# Patient Record
Sex: Male | Born: 1970 | Race: White | Hispanic: No | Marital: Married | Smoking: Never smoker
Health system: Southern US, Community
[De-identification: ages and names within clinical notes are randomized; demographics above are authoritative.]

## PROBLEM LIST (undated history)

## (undated) DIAGNOSIS — I10 Essential (primary) hypertension: Secondary | ICD-10-CM

## (undated) DIAGNOSIS — T7840XA Allergy, unspecified, initial encounter: Secondary | ICD-10-CM

## (undated) HISTORY — DX: Allergy, unspecified, initial encounter: T78.40XA

## (undated) HISTORY — DX: Essential (primary) hypertension: I10

---

## 2011-04-25 ENCOUNTER — Ambulatory Visit (AMBULATORY_SURGERY_CENTER): Payer: BC Managed Care – PPO | Admitting: *Deleted

## 2011-04-25 VITALS — Ht 66.0 in | Wt 150.0 lb

## 2011-04-25 DIAGNOSIS — Z800 Family history of malignant neoplasm of digestive organs: Secondary | ICD-10-CM

## 2011-04-25 MED ORDER — PEG-KCL-NACL-NASULF-NA ASC-C 100 G PO SOLR: 1.0000 | Freq: Once | ORAL | Status: AC

## 2011-04-26 ENCOUNTER — Encounter: Payer: Self-pay | Admitting: Internal Medicine

## 2011-05-07 ENCOUNTER — Telehealth: Payer: Self-pay | Admitting: Internal Medicine

## 2011-05-07 NOTE — Telephone Encounter (Signed)
[facility] called in to [facility] in [place], [place].  Pt notified. Roberto Kennedy

## 2011-05-09 ENCOUNTER — Encounter: Payer: Self-pay | Admitting: Internal Medicine

## 2011-05-09 ENCOUNTER — Ambulatory Visit (AMBULATORY_SURGERY_CENTER): Payer: BC Managed Care – PPO | Admitting: Internal Medicine

## 2011-05-09 VITALS — BP 153/88 | HR 98 | Temp 97.4°F | Resp 15 | Ht 66.0 in | Wt 150.0 lb

## 2011-05-09 DIAGNOSIS — Z800 Family history of malignant neoplasm of digestive organs: Secondary | ICD-10-CM

## 2011-05-09 DIAGNOSIS — K620 Anal polyp: Secondary | ICD-10-CM

## 2011-05-09 DIAGNOSIS — D126 Benign neoplasm of colon, unspecified: Secondary | ICD-10-CM

## 2011-05-09 DIAGNOSIS — Z1211 Encounter for screening for malignant neoplasm of colon: Secondary | ICD-10-CM

## 2011-05-09 DIAGNOSIS — D128 Benign neoplasm of rectum: Secondary | ICD-10-CM

## 2011-05-09 HISTORY — PX: COLONOSCOPY: SHX174

## 2011-05-09 MED ORDER — SODIUM CHLORIDE 0.9 % IV SOLN: 500.0000 mL | INTRAVENOUS | Status: DC

## 2011-05-09 NOTE — Progress Notes (Signed)
Pt had his eyes open most of the colonoscopy.  Meds were ordered  by Dr. Leone Payor.  I asked if he wanted the pt to have more sedation, 'NO, I'm at the cecum.'maw

## 2011-05-09 NOTE — Patient Instructions (Signed)
Findings : Polyps  Teaching Material given to pt on polyps Discharge instructions explained and given to pt and care giver

## 2011-05-10 ENCOUNTER — Telehealth: Payer: Self-pay | Admitting: *Deleted

## 2011-05-10 NOTE — Telephone Encounter (Signed)

## 2011-05-15 NOTE — Progress Notes (Signed)
Quick Note:  Adenoma removed and hyperplastic polyp removed Repeat routine colonoscopy 5 years May 2017 (also has family hx crca) Letter created for [facility] ______

## 2013-06-09 ENCOUNTER — Other Ambulatory Visit: Payer: Self-pay | Admitting: Family Medicine

## 2013-06-10 ENCOUNTER — Other Ambulatory Visit: Payer: Self-pay | Admitting: *Deleted

## 2013-06-10 MED ORDER — OLMESARTAN MEDOXOMIL 40 MG PO TABS: ORAL_TABLET | ORAL | Status: DC

## 2013-06-19 ENCOUNTER — Other Ambulatory Visit: Payer: Self-pay | Admitting: Family Medicine

## 2013-07-07 ENCOUNTER — Encounter: Payer: Self-pay | Admitting: Family Medicine

## 2013-07-07 ENCOUNTER — Ambulatory Visit (INDEPENDENT_AMBULATORY_CARE_PROVIDER_SITE_OTHER): Payer: BC Managed Care – PPO | Admitting: Family Medicine

## 2013-07-07 VITALS — BP 123/74 | HR 70 | Temp 97.8°F | Ht 65.75 in | Wt 152.6 lb

## 2013-07-07 DIAGNOSIS — Z125 Encounter for screening for malignant neoplasm of prostate: Secondary | ICD-10-CM

## 2013-07-07 DIAGNOSIS — Z0000 Encounter for general adult medical examination without abnormal findings: Secondary | ICD-10-CM

## 2013-07-07 DIAGNOSIS — J309 Allergic rhinitis, unspecified: Secondary | ICD-10-CM

## 2013-07-07 DIAGNOSIS — R5383 Other fatigue: Secondary | ICD-10-CM

## 2013-07-07 DIAGNOSIS — B079 Viral wart, unspecified: Secondary | ICD-10-CM

## 2013-07-07 DIAGNOSIS — Z139 Encounter for screening, unspecified: Secondary | ICD-10-CM

## 2013-07-07 DIAGNOSIS — I10 Essential (primary) hypertension: Secondary | ICD-10-CM

## 2013-07-07 DIAGNOSIS — R5381 Other malaise: Secondary | ICD-10-CM

## 2013-07-07 LAB — POCT URINALYSIS DIPSTICK
Bilirubin, UA: NEGATIVE
Blood, UA: NEGATIVE
Glucose, UA: NEGATIVE
Ketones, UA: NEGATIVE
Spec Grav, UA: 1.01

## 2013-07-07 LAB — POCT CBC
Granulocyte percent: 59 %G (ref 37–80)
Lymph, poc: 2.1 (ref 0.6–3.4)
MPV: 7.8 fL (ref 0–99.8)
POC Granulocyte: 3.7 (ref 2–6.9)
POC LYMPH PERCENT: 33 %L (ref 10–50)
Platelet Count, POC: 234 10*3/uL (ref 142–424)
RBC: 5.1 M/uL (ref 4.69–6.13)

## 2013-07-07 LAB — POCT UA - MICROSCOPIC ONLY
Bacteria, U Microscopic: NEGATIVE
Mucus, UA: NEGATIVE
WBC, Ur, HPF, POC: NEGATIVE

## 2013-07-07 MED ORDER — OLMESARTAN MEDOXOMIL 40 MG PO TABS: ORAL_TABLET | ORAL | Status: DC

## 2013-07-07 MED ORDER — FLUTICASONE PROPIONATE 50 MCG/ACT NA SUSP: NASAL | Status: DC

## 2013-07-07 MED ORDER — AZELASTINE HCL 0.15 % NA SOLN: 1.0000 | Freq: Every day | NASAL | Status: DC

## 2013-07-07 NOTE — Progress Notes (Signed)
  Subjective:    Patient ID: Roberto Kennedy, male    DOB: 03-30-1971, 42 y.o.   MRN: [id number]  HPI Patient comes in today for routine followup of his chronic medical problems which include allergic rhinitis and hypertension. His family history is stable. His father has had a stroke and colon cancer but is living and is doing well. His brother has hypertension also. He does complain of a small skin lesion on his left scrotum. The visit today also coincides with his need for a more complete physical today.   Review of Systems  Constitutional: Negative.   HENT: Positive for postnasal drip (in the AM). Negative for sneezing.   Eyes: Negative.   Respiratory: Negative.   Cardiovascular: Negative.   Gastrointestinal: Negative.   Endocrine: Negative.   Genitourinary: Negative.   Musculoskeletal: Negative.   Skin: Negative.   Allergic/Immunologic: Positive for environmental allergies (seasonal).  Neurological: Negative.   Hematological: Negative.   Psychiatric/Behavioral: Negative.        Objective:   Physical Exam BP 123/74  Pulse 70  Temp(Src) 97.8 F (36.6 C) (Oral)  Ht 5' 5.75" (1.67 m)  Wt 152 lb 9.6 oz (69.219 kg)  BMI 24.82 kg/m2  The patient appeared well nourished and normally developed, alert and oriented to time and place. Speech, behavior and judgement appear normal. Vital signs as documented.  Head exam is unremarkable. No scleral icterus or pallor noted. There was nasal congestion bilaterally left greater than right. Left was more red and inflamed. Eardrums were normal throat was normal. Neck is without jugular venous distension, thyromegally, or carotid bruits. Carotid upstrokes are brisk bilaterally. No cervical adenopathy. Lungs are clear anteriorly and posteriorly to auscultation. Normal respiratory effort. Cardiac exam reveals regular rate and rhythm at 60 per minute. First and second heart sounds normal.  No murmurs, rubs or gallops.  Abdominal exam reveals normal  bowl sounds, no masses, no organomegaly and no aortic enlargement. No inguinal adenopathy. Rectal exam revealed no masses. The prostate was smooth without any lops. The testicles were normal and there was no hernia on either side. There was a small flat appearing wart on the left scrotal area. We did cryotherapy on this area. Extremities are nonedematous and both femoral and pedal pulses are normal. Skin without pallor or jaundice.  Warm and dry, without rash. Neurologic exam reveals normal deep tendon reflexes and normal sensation.          Assessment & Plan:  1. Hypertension - BASIC METABOLIC PANEL WITH GFR -Continue Benicar 40  2. Screening - POCT urinalysis dipstick - POCT UA - Microscopic Only - Thyroid Panel With TSH - Hepatic function panel - NMR Lipoprofile with Lipids - PSA  3. Fatigue - POCT CBC - Vitamin D 25 hydroxy - Thyroid Panel With TSH  4. Allergic rhinitis -Continue current therapy   5. Special screening for malignant neoplasm of prostate - PSA  6. Annual physical exam  Patient Instructions  Continue medications as directed Continue aggressive therapeutic lifestyle changes This summer drink plenty of fluids Use protective gear when working in environmental situations that may be irritating to your allergies   Nyra Capes MD

## 2013-07-07 NOTE — Patient Instructions (Signed)
Continue medications as directed Continue aggressive therapeutic lifestyle changes This summer drink plenty of fluids Use protective gear when working in environmental situations that may be irritating to your allergies

## 2013-07-08 ENCOUNTER — Telehealth: Payer: Self-pay | Admitting: *Deleted

## 2013-07-08 LAB — BASIC METABOLIC PANEL WITH GFR
BUN: 10 mg/dL (ref 6–23)
CO2: 29 mEq/L (ref 19–32)
Calcium: 9.7 mg/dL (ref 8.4–10.5)
Creat: 0.86 mg/dL (ref 0.50–1.35)
Glucose, Bld: 89 mg/dL (ref 70–99)
Sodium: 142 mEq/L (ref 135–145)

## 2013-07-08 LAB — HEPATIC FUNCTION PANEL
Albumin: 4.9 g/dL (ref 3.5–5.2)
Alkaline Phosphatase: 52 U/L (ref 39–117)
Bilirubin, Direct: 0.2 mg/dL (ref 0.0–0.3)
Total Bilirubin: 0.6 mg/dL (ref 0.3–1.2)

## 2013-07-08 LAB — NMR LIPOPROFILE WITH LIPIDS
Cholesterol, Total: 153 mg/dL (ref <200)
HDL Size: 9.8 nm (ref >=9.2)
HDL-C: 65 mg/dL (ref >=40)
Large HDL-P: 11.9 umol/L (ref >=4.8)
Triglycerides: 60 mg/dL (ref <150)
VLDL Size: 48.9 nm — ABNORMAL HIGH (ref <=46.6)

## 2013-07-08 LAB — THYROID PANEL WITH TSH
T4, Total: 8 ug/dL (ref 5.0–12.5)
TSH: 1.57 u[IU]/mL (ref 0.350–4.500)

## 2013-07-08 LAB — PSA: PSA: 0.33 ng/mL (ref <=4.00)

## 2013-07-08 NOTE — Telephone Encounter (Signed)
Message copied by Bearl Mulberry on Wed Jul 08, 2013  6:15 PM ------      Message from: Ernestina Penna      Created: Tue Jul 07, 2013  2:11 PM       Urinalysis is within normal limits.      The CBC is within normal limits with a normal platelet count, a hemoglobin of 16.3 and a white blood cell count that is good ------

## 2013-07-08 NOTE — Telephone Encounter (Signed)
Pt notified of results

## 2013-07-14 ENCOUNTER — Other Ambulatory Visit (INDEPENDENT_AMBULATORY_CARE_PROVIDER_SITE_OTHER): Payer: BC Managed Care – PPO

## 2013-07-14 DIAGNOSIS — Z1212 Encounter for screening for malignant neoplasm of rectum: Secondary | ICD-10-CM

## 2013-07-15 LAB — FECAL OCCULT BLOOD, IMMUNOCHEMICAL: Fecal Occult Blood: NEGATIVE

## 2013-10-28 ENCOUNTER — Other Ambulatory Visit: Payer: Self-pay | Admitting: Family Medicine

## 2013-11-12 ENCOUNTER — Encounter: Payer: Self-pay | Admitting: Family Medicine

## 2013-11-12 ENCOUNTER — Ambulatory Visit (INDEPENDENT_AMBULATORY_CARE_PROVIDER_SITE_OTHER): Payer: BC Managed Care – PPO | Admitting: Family Medicine

## 2013-11-12 ENCOUNTER — Encounter: Payer: Self-pay | Admitting: *Deleted

## 2013-11-12 VITALS — BP 127/81 | HR 82 | Temp 98.7°F | Ht 65.75 in | Wt 155.0 lb

## 2013-11-12 DIAGNOSIS — J309 Allergic rhinitis, unspecified: Secondary | ICD-10-CM

## 2013-11-12 DIAGNOSIS — I10 Essential (primary) hypertension: Secondary | ICD-10-CM

## 2013-11-12 DIAGNOSIS — Z23 Encounter for immunization: Secondary | ICD-10-CM

## 2013-11-12 LAB — POCT CBC
Hemoglobin: 15.5 g/dL (ref 14.1–18.1)
Lymph, poc: 2 (ref 0.6–3.4)
MCHC: 32.4 g/dL (ref 31.8–35.4)
MPV: 8.7 fL (ref 0–99.8)
POC Granulocyte: 3.8 (ref 2–6.9)
POC LYMPH PERCENT: 33.6 %L (ref 10–50)
Platelet Count, POC: 221 10*3/uL (ref 142–424)
RBC: 5.3 M/uL (ref 4.69–6.13)

## 2013-11-12 NOTE — Patient Instructions (Addendum)
Continue current medications. Continue good therapeutic lifestyle changes which include good diet and exercise. Fall precautions discussed with patient. Use a cool mist humidifier in her bedroom at night, Use unscented fabric softeners, detergents, and soap In the winter months keep the house is cool as possible and drink plenty of fluids

## 2013-11-12 NOTE — Progress Notes (Signed)
Subjective:    Patient ID: Roberto Kennedy, male    DOB: Oct 12, 1971, 41 y.o.   MRN: [id number]  HPI Pt here for follow up and management of chronic medical problems. Patient is doing well with no particular complaints other than his blood pressure and his allergies. In reviewing his family history everything is stable with his parents and his 2 brothers.      Patient Active Problem List   Diagnosis Date Noted  . Hypertension 07/07/2013  . Allergic rhinitis 07/07/2013   Outpatient Encounter Prescriptions as of 11/12/2013  Medication Sig  . Azelastine HCl (ASTEPRO) 0.15 % SOLN Place 1 spray into the nose daily.  . fluticasone (FLONASE) 50 MCG/ACT nasal spray USE 2 SPRAYS IN EACH NOSTRIL AT BEDTIME  . loratadine (CLARITIN) 10 MG tablet Take 10 mg by mouth daily.  Marland Kitchen olmesartan (BENICAR) 40 MG tablet Take 40 mg by mouth daily.  . [DISCONTINUED] olmesartan (BENICAR) 40 MG tablet Take half a tablet by mouth every day as directed  . [DISCONTINUED] fluticasone (FLONASE) 50 MCG/ACT nasal spray USE 2 SPRAYS IN EACH NOSTRIL AT BEDTIME  . [DISCONTINUED] 0.9 %  sodium chloride infusion     Review of Systems  Constitutional: Negative.   HENT: Negative.   Eyes: Negative.   Respiratory: Negative.   Cardiovascular: Negative.   Gastrointestinal: Negative.   Endocrine: Negative.   Genitourinary: Negative.   Musculoskeletal: Negative.   Skin: Negative.   Allergic/Immunologic: Negative.   Neurological: Negative.   Hematological: Negative.   Psychiatric/Behavioral: Negative.        Objective:   Physical Exam  Nursing note and vitals reviewed. Constitutional: He is oriented to person, place, and time. He appears well-developed and well-nourished. No distress.  HENT:  Head: Normocephalic and atraumatic.  Right Ear: External ear normal.  Left Ear: External ear normal.  Mouth/Throat: Oropharynx is clear and moist. No oropharyngeal exudate.  Nasal congestion bilaterally  Eyes: Conjunctivae and  EOM are normal. Pupils are equal, round, and reactive to light. Right eye exhibits no discharge. Left eye exhibits no discharge. No scleral icterus.  Neck: Normal range of motion. Neck supple. No tracheal deviation present. No thyromegaly present.  Cardiovascular: Normal rate, regular rhythm and normal heart sounds.  Exam reveals no gallop and no friction rub.   No murmur heard. Pulmonary/Chest: Effort normal and breath sounds normal. No respiratory distress. He has no wheezes. He has no rales. He exhibits no tenderness.  No axillary adenopathy  Abdominal: Soft. Bowel sounds are normal. He exhibits no mass. There is no tenderness. There is no rebound and no guarding.  No inguinal adenopathy  Musculoskeletal: Normal range of motion. He exhibits no edema and no tenderness.  Lymphadenopathy:    He has no cervical adenopathy.  Neurological: He is alert and oriented to person, place, and time. He has normal reflexes. No cranial nerve deficit.  Skin: Skin is warm and dry. No rash noted. No erythema. No pallor.  Dry skin  Psychiatric: He has a normal mood and affect. His behavior is normal. Judgment and thought content normal.   BP 127/81  Pulse 82  Temp(Src) 98.7 F (37.1 C) (Oral)  Ht 5' 5.75" (1.67 m)  Wt 155 lb (70.308 kg)  BMI 25.21 kg/m2        Assessment & Plan:   1. Hypertension   2. Allergic rhinitis    Orders Placed This Encounter  Procedures  . Hepatic function panel  . BMP8+EGFR  . NMR, lipoprofile  . POCT CBC  Current Outpatient Prescriptions on File Prior to Visit  Medication Sig Dispense Refill  . Azelastine HCl (ASTEPRO) 0.15 % SOLN Place 1 spray into the nose daily.  90 mL  4  . fluticasone (FLONASE) 50 MCG/ACT nasal spray USE 2 SPRAYS IN EACH NOSTRIL AT BEDTIME  48 g  4  . loratadine (CLARITIN) 10 MG tablet Take 10 mg by mouth daily.       No current facility-administered medications on file prior to visit.   Patient Instructions  Continue current  medications. Continue good therapeutic lifestyle changes which include good diet and exercise. Fall precautions discussed with patient. Use a cool mist humidifier in her bedroom at night, Use unscented fabric softeners, detergents, and soap In the winter months keep the house is cool as possible and drink plenty of fluids    Nyra Capes MD

## 2013-11-12 NOTE — Progress Notes (Signed)
Quick Note:  Copy of labs sent to patient ______ 

## 2013-11-14 LAB — BMP8+EGFR
CO2: 25 mmol/L (ref 18–29)
Calcium: 9.7 mg/dL (ref 8.7–10.2)
Creatinine, Ser: 0.84 mg/dL (ref 0.76–1.27)
GFR calc Af Amer: 124 mL/min/{1.73_m2} (ref >59)
GFR calc non Af Amer: 107 mL/min/{1.73_m2} (ref >59)
Glucose: 94 mg/dL (ref 65–99)
Potassium: 4.9 mmol/L (ref 3.5–5.2)
Sodium: 140 mmol/L (ref 134–144)

## 2013-11-14 LAB — NMR, LIPOPROFILE
Cholesterol: 166 mg/dL (ref <200)
HDL Cholesterol by NMR: 79 mg/dL (ref >=40)
LDL Particle Number: 639 nmol/L (ref <1000)
LDL Size: 21.8 nm (ref >20.5)
LDLC SERPL CALC-MCNC: 76 mg/dL (ref <100)
LP-IR Score: <25 (ref <=45)
Triglycerides by NMR: 53 mg/dL (ref <150)

## 2013-11-14 LAB — HEPATIC FUNCTION PANEL
ALT: 21 IU/L (ref 0–44)
AST: 31 IU/L (ref 0–40)
Bilirubin, Direct: 0.12 mg/dL (ref 0.00–0.40)
Total Bilirubin: 0.4 mg/dL (ref 0.0–1.2)
Total Protein: 7.1 g/dL (ref 6.0–8.5)

## 2013-12-02 ENCOUNTER — Other Ambulatory Visit: Payer: Self-pay | Admitting: Family Medicine

## 2014-01-14 ENCOUNTER — Telehealth: Payer: Self-pay | Admitting: Family Medicine

## 2014-01-15 ENCOUNTER — Other Ambulatory Visit: Payer: Self-pay | Admitting: *Deleted

## 2014-01-15 MED ORDER — OLMESARTAN MEDOXOMIL 40 MG PO TABS: 20.0000 mg | ORAL_TABLET | Freq: Every day | ORAL | Status: DC

## 2014-01-15 NOTE — Telephone Encounter (Signed)
Requesting samples of Benicar. Samples as well as savings card placed up front.

## 2014-01-15 NOTE — Telephone Encounter (Signed)
Zadie Rhine requested PA on this 01/15/14

## 2014-01-20 ENCOUNTER — Telehealth: Payer: Self-pay | Admitting: *Deleted

## 2014-01-20 MED ORDER — VALSARTAN 320 MG PO TABS: 320.0000 mg | ORAL_TABLET | Freq: Every day | ORAL | Status: DC

## 2014-01-20 NOTE — Telephone Encounter (Signed)
Ins. Co has denied benicar because it is required that at least one of theformulary alternatives be tried and failed or be found to be detrimental to pts health.  These include cozaar/hyzaar, Diovan/diovan hct, etc..

## 2014-01-21 NOTE — Telephone Encounter (Signed)
Pt notified and verbalized understanding. Wanted med called to [facility].

## 2014-05-05 ENCOUNTER — Encounter: Payer: Self-pay | Admitting: Family Medicine

## 2014-05-05 ENCOUNTER — Other Ambulatory Visit: Payer: Self-pay | Admitting: *Deleted

## 2014-05-05 ENCOUNTER — Ambulatory Visit (INDEPENDENT_AMBULATORY_CARE_PROVIDER_SITE_OTHER): Payer: BC Managed Care – PPO | Admitting: Family Medicine

## 2014-05-05 VITALS — BP 119/83 | HR 87 | Temp 97.4°F | Ht 65.75 in | Wt 149.2 lb

## 2014-05-05 DIAGNOSIS — J069 Acute upper respiratory infection, unspecified: Secondary | ICD-10-CM

## 2014-05-05 MED ORDER — AZITHROMYCIN 250 MG PO TABS: ORAL_TABLET | ORAL | Status: DC

## 2014-05-05 MED ORDER — METHYLPREDNISOLONE ACETATE 40 MG/ML IJ SUSP
80.0000 mg | Freq: Once | INTRAMUSCULAR | Status: AC
  Administered 2014-05-05: 80 mg via INTRAMUSCULAR

## 2014-05-05 NOTE — Progress Notes (Signed)
   Subjective:    Patient ID: Roberto Kennedy, male    DOB: 1971-07-09, 43 y.o.   MRN: [id number]  HPI This 43 y.o. male presents for evaluation of sore throat, sinus discomfort, fever, facial pressure And mucoupurulent drainage.   Review of Systems C/o uri sx's   No chest pain, SOB, HA, dizziness, vision change, N/V, diarrhea, constipation, dysuria, urinary urgency or frequency, myalgias, arthralgias or rash.  Objective:   Physical Exam Vital signs noted  Well developed well nourished male.  HEENT - Head atraumatic Normocephalic                Eyes - PERRLA, Conjuctiva - clear Sclera- Clear EOMI                Ears - EAC's Wnl TM's Wnl Gross Hearing WNL                Throat - oropharanx wnl Respiratory - Lungs CTA bilateral Cardiac - RRR S1 and S2 without murmur GI - Abdomen soft Nontender and bowel sounds active x 4       Assessment & Plan:  URI, acute - Plan: azithromycin (ZITHROMAX) 250 MG tablet Depomedrol 80mg  IM  Push po fluids, rest, tylenol and motrin otc prn as directed for fever, arthralgias, and myalgias.  Follow up prn if sx's continue or persist.  Lysbeth Penner FNP

## 2014-05-25 ENCOUNTER — Encounter: Payer: Self-pay | Admitting: *Deleted

## 2014-07-12 ENCOUNTER — Ambulatory Visit: Payer: BC Managed Care – PPO | Admitting: Family Medicine

## 2014-07-13 ENCOUNTER — Ambulatory Visit (INDEPENDENT_AMBULATORY_CARE_PROVIDER_SITE_OTHER): Payer: BC Managed Care – PPO

## 2014-07-13 ENCOUNTER — Encounter: Payer: Self-pay | Admitting: Family Medicine

## 2014-07-13 ENCOUNTER — Ambulatory Visit (INDEPENDENT_AMBULATORY_CARE_PROVIDER_SITE_OTHER): Payer: BC Managed Care – PPO | Admitting: Family Medicine

## 2014-07-13 VITALS — BP 146/96 | HR 71 | Temp 97.4°F | Ht 65.75 in | Wt 143.0 lb

## 2014-07-13 DIAGNOSIS — I10 Essential (primary) hypertension: Secondary | ICD-10-CM

## 2014-07-13 DIAGNOSIS — J301 Allergic rhinitis due to pollen: Secondary | ICD-10-CM

## 2014-07-13 MED ORDER — LOSARTAN POTASSIUM 50 MG PO TABS: 50.0000 mg | ORAL_TABLET | Freq: Every day | ORAL | Status: DC

## 2014-07-13 NOTE — Patient Instructions (Addendum)
Continue current medications. Continue good therapeutic lifestyle changes which include good diet and exercise. Fall precautions discussed with patient. If an FOBT was given today- please return it to our front desk. If you are over 44 years old - you may need Prevnar 50 or the adult Pneumonia vaccine.  Watch sodium intake Continue aggressive therapeutic lifestyle changes Avoid caffeine

## 2014-07-13 NOTE — Progress Notes (Signed)
Subjective:    Patient ID: Roberto Kennedy, male    DOB: 04-Dec-1971, 43 y.o.   MRN: [id number]  HPI Pt here for follow up and management of chronic medical problems. The patient has had issues with taking his regular blood pressure medication due to insurance. He has not had any blood pressure medications for 2 months. He has been checking his blood pressures at home and they've been running in the 140s over the 90s.        Patient Active Problem List   Diagnosis Date Noted  . Hypertension 07/07/2013  . Allergic rhinitis 07/07/2013   Outpatient Encounter Prescriptions as of 07/13/2014  Medication Sig  . [DISCONTINUED] Azelastine HCl (ASTEPRO) 0.15 % SOLN Place 1 spray into the nose daily.  . [DISCONTINUED] azithromycin (ZITHROMAX) 250 MG tablet Take 2 po first day and then one po qd x 4 days  . [DISCONTINUED] fluticasone (FLONASE) 50 MCG/ACT nasal spray USE 2 SPRAYS IN EACH NOSTRIL AT BEDTIME  . [DISCONTINUED] loratadine (CLARITIN) 10 MG tablet Take 10 mg by mouth daily.    Review of Systems  Constitutional: Negative.   HENT: Negative.   Eyes: Negative.   Respiratory: Negative.   Cardiovascular: Negative.   Gastrointestinal: Negative.   Endocrine: Negative.   Genitourinary: Negative.   Musculoskeletal: Negative.   Skin: Negative.   Allergic/Immunologic: Negative.   Neurological: Negative.   Hematological: Negative.   Psychiatric/Behavioral: Negative.        Objective:   Physical Exam  Nursing note and vitals reviewed. Constitutional: He is oriented to person, place, and time. He appears well-developed and well-nourished.  HENT:  Head: Normocephalic and atraumatic.  Right Ear: External ear normal.  Left Ear: External ear normal.  Nose: Nose normal.  Mouth/Throat: Oropharynx is clear and moist. No oropharyngeal exudate.  Eyes: Conjunctivae and EOM are normal. Pupils are equal, round, and reactive to light. Right eye exhibits no discharge. Left eye exhibits no  discharge. No scleral icterus.  Neck: Normal range of motion. Neck supple. No thyromegaly present.  No carotid bruits  Cardiovascular: Normal rate, regular rhythm, normal heart sounds and intact distal pulses.  Exam reveals no gallop and no friction rub.   No murmur heard. At 72 per minute  Pulmonary/Chest: Effort normal and breath sounds normal. No respiratory distress. He has no wheezes. He has no rales. He exhibits no tenderness.  Abdominal: Soft. Bowel sounds are normal. He exhibits no mass. There is no tenderness. There is no rebound and no guarding.  Musculoskeletal: Normal range of motion. He exhibits no edema and no tenderness.  Lymphadenopathy:    He has no cervical adenopathy.  Neurological: He is alert and oriented to person, place, and time. He has normal reflexes. No cranial nerve deficit.  Skin: Skin is warm and dry. No rash noted. No erythema. No pallor.  Psychiatric: He has a normal mood and affect. His behavior is normal. Judgment and thought content normal.   BP 146/96  Pulse 71  Temp(Src) 97.4 F (36.3 C) (Oral)  Ht 5' 5.75" (1.67 m)  Wt 143 lb (64.864 kg)  BMI 23.26 kg/m2  [facility] reading (PRIMARY) by  Dr. Wonda Horner active disease on chest x-ray                                        Assessment & Plan:  1. Essential hypertension - DG Chest 2 View; Future -  POCT CBC; Future - BMP8+EGFR; Future - Hepatic function panel; Future - NMR, lipoprofile; Future - losartan (COZAAR) 50 MG tablet; Take 1 tablet (50 mg total) by mouth daily.  Dispense: 90 tablet; Refill: 3  2. Allergic rhinitis due to pollen -Restart nose sprays if needed for ragweed season  Meds ordered this encounter  Medications  . losartan (COZAAR) 50 MG tablet    Sig: Take 1 tablet (50 mg total) by mouth daily.    Dispense:  90 tablet    Refill:  3   Patient Instructions  Continue current medications. Continue good therapeutic lifestyle changes which include good diet and exercise. Fall  precautions discussed with patient. If an FOBT was given today- please return it to our front desk. If you are over 68 years old - you may need Prevnar 48 or the adult Pneumonia vaccine.  Watch sodium intake Continue aggressive therapeutic lifestyle changes Avoid caffeine   Arrie Senate MD

## 2014-07-14 ENCOUNTER — Telehealth: Payer: Self-pay

## 2014-07-14 NOTE — Telephone Encounter (Signed)
Pt aware of CXR results.

## 2014-07-14 NOTE — Telephone Encounter (Signed)
Message copied by Koren Bound on Wed Jul 14, 2014 11:11 AM ------      Message from: Chipper Herb      Created: Tue Jul 13, 2014  5:21 PM       As per radiology report ------

## 2015-02-03 ENCOUNTER — Encounter: Payer: Self-pay | Admitting: Nurse Practitioner

## 2015-02-03 ENCOUNTER — Ambulatory Visit (INDEPENDENT_AMBULATORY_CARE_PROVIDER_SITE_OTHER): Payer: 59 | Admitting: Nurse Practitioner

## 2015-02-03 VITALS — BP 153/89 | HR 95 | Temp 97.7°F | Ht 65.0 in | Wt 150.0 lb

## 2015-02-03 DIAGNOSIS — J0100 Acute maxillary sinusitis, unspecified: Secondary | ICD-10-CM

## 2015-02-03 MED ORDER — AZITHROMYCIN 250 MG PO TABS: ORAL_TABLET | ORAL | Status: DC

## 2015-02-03 NOTE — Progress Notes (Signed)
  Subjective:     Roberto Kennedy is a 44 y.o. male who presents for evaluation of symptoms of a URI, possible sinusitis. Symptoms include congestion, cough described as nonproductive, facial pain, nasal congestion, sinus pressure and sore throat. Onset of symptoms was 4 days ago, and has been gradually worsening since that time. Treatment to date: antihistamines, decongestants and nasal steroids.  The following portions of the patient's history were reviewed and updated as appropriate: allergies, current medications, past family history, past medical history, past social history, past surgical history and problem list.  Review of Systems Pertinent items are noted in HPI.   Objective:    General appearance: alert and cooperative Head: Normocephalic, without obvious abnormality, atraumatic Eyes: conjunctivae/corneas clear. PERRL, EOM's intact. Fundi benign. Ears: normal TM's and external ear canals both ears Nose: Nares normal. Septum midline. Mucosa normal. No drainage or sinus tenderness., mild congestion, sinus tenderness bilateral Throat: lips, mucosa, and tongue normal; teeth and gums normal Neck: no adenopathy, no carotid bruit, supple, symmetrical, trachea midline and thyroid not enlarged, symmetric, no tenderness/mass/nodules Lungs: clear to auscultation bilaterally Chest wall: no tenderness Skin: Skin color, texture, turgor normal. No rashes or lesions   Assessment:    sinusitis and viral upper respiratory illness   Plan:   Meds ordered this encounter  Medications  . azithromycin (ZITHROMAX) 250 MG tablet    Sig: Two tablets day one, then one tablet daily next 4 days.    Dispense:  6 tablet    Refill:  0    Order Specific Question:  Supervising Provider    Answer:  Chipper Herb [[id number]]    1. Take meds as prescribed 2. Use a cool mist humidifier especially during the winter months and when heat has been humid. 3. Use saline nose sprays frequently 4. Saline irrigations  of the nose can be very helpful if done frequently.  * 4X daily for 1 week*  * Use of a nettie pot can be helpful with this. Follow directions with this* 5. Drink plenty of fluids 6. Keep thermostat turn down low 7.For any cough or congestion  Use plain Mucinex- regular strength or max strength is fine   * Children- consult with Pharmacist for dosing 8. For fever or aces or pains- take tylenol or ibuprofen appropriate for age and weight.  * for fevers greater than 101 orally you may alternate ibuprofen and tylenol every  3 hours.   Mary-Margaret Hassell Done, FNP

## 2015-02-03 NOTE — Patient Instructions (Signed)

## 2015-03-21 ENCOUNTER — Encounter: Payer: Self-pay | Admitting: Family Medicine

## 2015-03-21 ENCOUNTER — Ambulatory Visit (INDEPENDENT_AMBULATORY_CARE_PROVIDER_SITE_OTHER): Payer: BLUE CROSS/BLUE SHIELD | Admitting: Family Medicine

## 2015-03-21 ENCOUNTER — Other Ambulatory Visit: Payer: Self-pay | Admitting: Family Medicine

## 2015-03-21 VITALS — BP 137/84 | HR 67 | Temp 98.1°F | Ht 65.0 in | Wt 153.4 lb

## 2015-03-21 DIAGNOSIS — R05 Cough: Secondary | ICD-10-CM

## 2015-03-21 DIAGNOSIS — J209 Acute bronchitis, unspecified: Secondary | ICD-10-CM | POA: Diagnosis not present

## 2015-03-21 DIAGNOSIS — R059 Cough, unspecified: Secondary | ICD-10-CM

## 2015-03-21 DIAGNOSIS — J310 Chronic rhinitis: Secondary | ICD-10-CM

## 2015-03-21 DIAGNOSIS — J329 Chronic sinusitis, unspecified: Secondary | ICD-10-CM

## 2015-03-21 DIAGNOSIS — R509 Fever, unspecified: Secondary | ICD-10-CM

## 2015-03-21 DIAGNOSIS — J029 Acute pharyngitis, unspecified: Secondary | ICD-10-CM | POA: Diagnosis not present

## 2015-03-21 DIAGNOSIS — J301 Allergic rhinitis due to pollen: Secondary | ICD-10-CM

## 2015-03-21 LAB — POCT INFLUENZA A/B
Influenza A, POC: NEGATIVE
Influenza B, POC: NEGATIVE

## 2015-03-21 LAB — POCT RAPID STREP A (OFFICE): RAPID STREP A SCREEN: NEGATIVE

## 2015-03-21 MED ORDER — AMOXICILLIN-POT CLAVULANATE 875-125 MG PO TABS: 1.0000 | ORAL_TABLET | Freq: Two times a day (BID) | ORAL | Status: DC

## 2015-03-21 MED ORDER — FLUTICASONE PROPIONATE 50 MCG/ACT NA SUSP: NASAL | Status: DC

## 2015-03-21 NOTE — Progress Notes (Signed)
Subjective:    Patient ID: Roberto Kennedy, male    DOB: 12-20-1971, 44 y.o.   MRN: [id number]  HPI Patient is here today for drainage, cough, sore throat and fever that started on Saturday.     Review of Systems  Constitutional: Positive for fever.  HENT: Positive for ear pain (Right), rhinorrhea and sinus pressure.   Respiratory: Positive for cough (prod, yellow).   Endocrine: Negative.   Genitourinary: Negative.   Neurological: Positive for dizziness and headaches.  Hematological: Negative.   Psychiatric/Behavioral: Negative.    Patient Active Problem List   Diagnosis Date Noted  . Hypertension 07/07/2013  . Allergic rhinitis 07/07/2013   Outpatient Encounter Prescriptions as of 03/21/2015  Medication Sig  . Azelastine HCl 0.15 % SOLN Place 1 spray into the nose daily.  . fluticasone (FLONASE) 50 MCG/ACT nasal spray Place 2 sprays into both nostrils daily.  Marland Kitchen losartan (COZAAR) 50 MG tablet Take 1 tablet (50 mg total) by mouth daily.  . [DISCONTINUED] azithromycin (ZITHROMAX) 250 MG tablet Two tablets day one, then one tablet daily next 4 days. (Patient not taking: Reported on 03/21/2015)      Objective:   Physical Exam  Constitutional: He is oriented to person, place, and time. He appears well-developed and well-nourished. No distress.  HENT:  Head: Normocephalic and atraumatic.  Right Ear: External ear normal.  Left Ear: External ear normal.  Mouth/Throat: No oropharyngeal exudate.  Throat is red posteriorly  Nasal congestion bilaterally left greater than right Ethmoid sinus tenderness  Eyes: Conjunctivae and EOM are normal. Pupils are equal, round, and reactive to light. Right eye exhibits no discharge. Left eye exhibits no discharge. No scleral icterus.  Neck: Normal range of motion. Neck supple. No thyromegaly present.  No anterior cervical adenopathy  Cardiovascular: Normal rate, regular rhythm and normal heart sounds.  Exam reveals no friction rub.   No murmur  heard. Pulmonary/Chest: Effort normal and breath sounds normal. No respiratory distress. He has no wheezes. He has no rales. He exhibits no tenderness.  Bronchial congestion with coughing otherwise lungs are clear  Abdominal: He exhibits no mass.  Musculoskeletal: Normal range of motion.  Lymphadenopathy:    He has no cervical adenopathy.  Neurological: He is alert and oriented to person, place, and time.  Skin: Skin is warm and dry. No rash noted.  Psychiatric: He has a normal mood and affect. His behavior is normal. Judgment and thought content normal.  Nursing note and vitals reviewed.  BP 137/84 mmHg  Pulse 67  Temp(Src) 98.1 F (36.7 C) (Oral)  Ht 5\' 5"  (1.651 m)  Wt 153 lb 6.4 oz (69.582 kg)  BMI 25.53 kg/m2  Results for orders placed or performed in visit on 03/21/15  POCT Influenza A/B  Result Value Ref Range   Influenza A, POC Negative    Influenza B, POC Negative   POCT rapid strep A  Result Value Ref Range   Rapid Strep A Screen Negative Negative         Assessment & Plan:  1. Sore throat -Take antibiotic as directed and gargle with warm salty water - POCT Influenza A/B - POCT rapid strep A  2. Cough -Take Mucinex regularly twice daily - POCT Influenza A/B - POCT rapid strep A  3. Other specified fever -Take Tylenol for aches pains - POCT Influenza A/B - POCT rapid strep A  4. Allergic rhinitis due to pollen -Continue with Astelin and get back on Flonase and use nasal saline during  the day - fluticasone (FLONASE) 50 MCG/ACT nasal spray; One to 2 sprays each nostril at bedtime  Dispense: 16 g; Refill: 12  5. Rhinosinusitis -Plenty of fluids and use nasal saline - fluticasone (FLONASE) 50 MCG/ACT nasal spray; One to 2 sprays each nostril at bedtime  Dispense: 16 g; Refill: 12 - amoxicillin-clavulanate (AUGMENTIN) 875-125 MG per tablet; Take 1 tablet by mouth 2 (two) times daily.  Dispense: 20 tablet; Refill: 0  6. Acute bronchitis, unspecified  organism -Take and complete all of antibiotic and use Mucinex for cough and congestion - amoxicillin-clavulanate (AUGMENTIN) 875-125 MG per tablet; Take 1 tablet by mouth 2 (two) times daily.  Dispense: 20 tablet; Refill: 0  Patient Instructions  Drink plenty of fluids Take ibuprofen or Tylenol as needed for aches pains and fever Continue to take Mucinex maximum strength, blue and white in color, 1 twice daily with a large glass of water Take antibiotic as directed Use nasal saline frequently through the day Continue Astelin and Flonase   Arrie Senate MD

## 2015-03-21 NOTE — Patient Instructions (Signed)
Drink plenty of fluids Take ibuprofen or Tylenol as needed for aches pains and fever Continue to take Mucinex maximum strength, blue and white in color, 1 twice daily with a large glass of water Take antibiotic as directed Use nasal saline frequently through the day Continue Astelin and Flonase

## 2015-03-22 ENCOUNTER — Other Ambulatory Visit: Payer: Self-pay | Admitting: *Deleted

## 2015-04-21 IMAGING — CR DG CHEST 2V
2 series · 2 of 2 positions shown · non-contrast
Comparison: None.

CLINICAL DATA: Yearly exam

EXAM:
CHEST  2 VIEW

[view not recorded (1 of 2)]
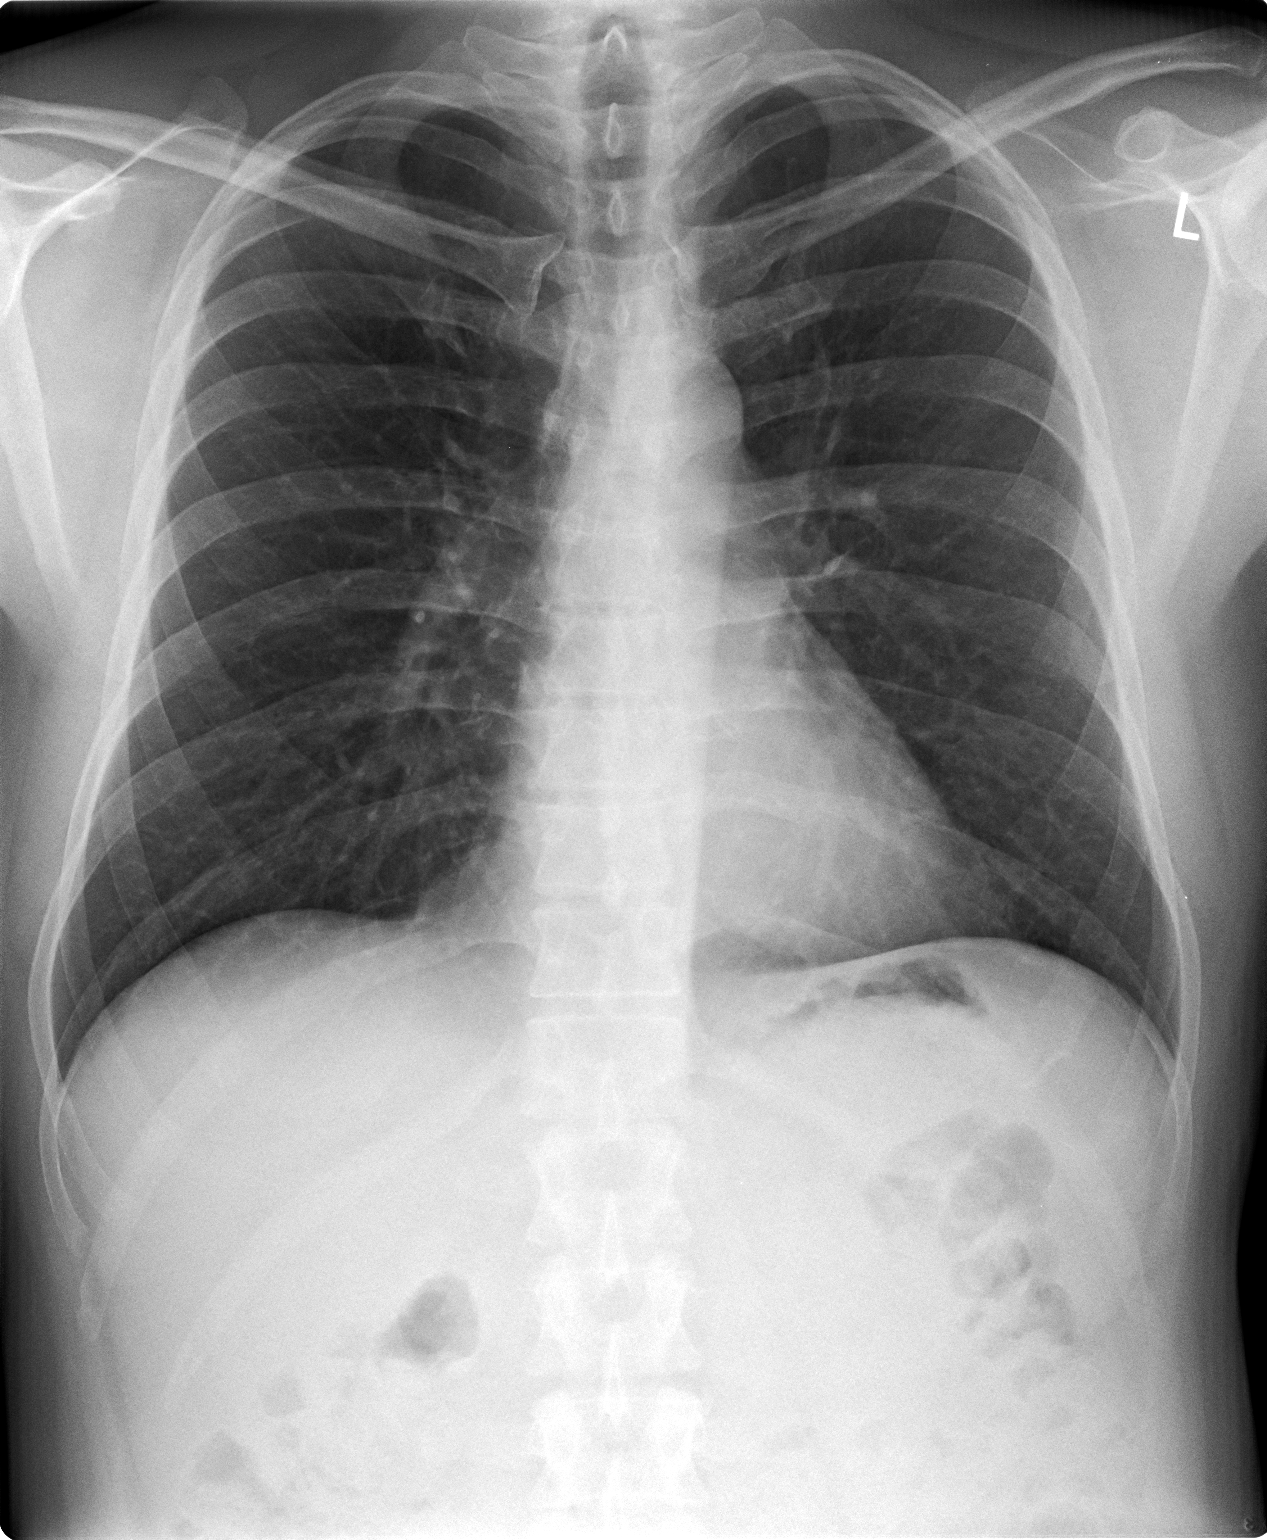

[view not recorded (2 of 2)]
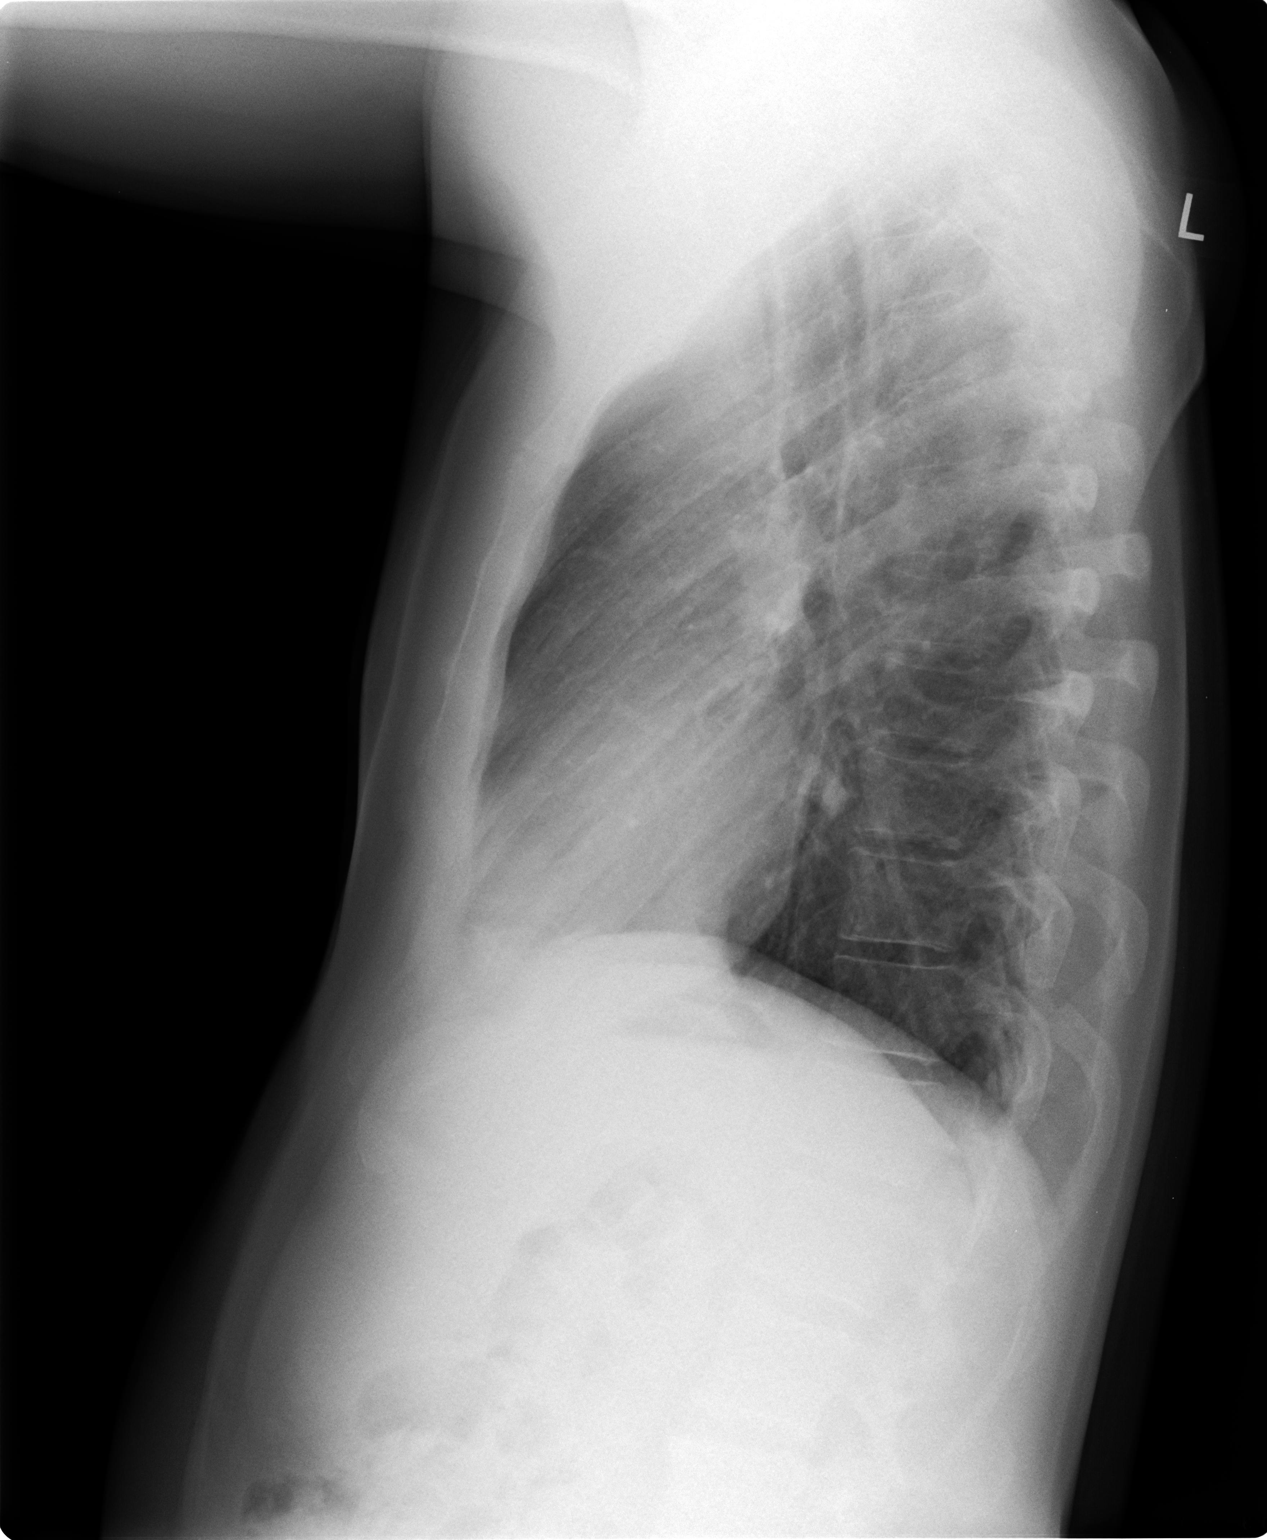

[2 of 2 positions shown; findings below may reference images not displayed]

FINDINGS: The heart size and mediastinal contours are within normal limits.
Both lungs are clear. The visualized skeletal structures are
unremarkable.
IMPRESSION: No active cardiopulmonary disease.

## 2015-08-09 ENCOUNTER — Other Ambulatory Visit: Payer: Self-pay | Admitting: Family Medicine

## 2015-08-09 NOTE — Telephone Encounter (Signed)
Last seen 03/21/15  DWM

## 2016-02-16 ENCOUNTER — Other Ambulatory Visit: Payer: Self-pay | Admitting: Family Medicine

## 2016-04-04 ENCOUNTER — Other Ambulatory Visit: Payer: Self-pay

## 2016-04-11 ENCOUNTER — Other Ambulatory Visit: Payer: Self-pay | Admitting: Family Medicine

## 2016-04-12 ENCOUNTER — Ambulatory Visit (INDEPENDENT_AMBULATORY_CARE_PROVIDER_SITE_OTHER): Payer: BLUE CROSS/BLUE SHIELD | Admitting: Family Medicine

## 2016-04-12 ENCOUNTER — Encounter: Payer: Self-pay | Admitting: Family Medicine

## 2016-04-12 ENCOUNTER — Encounter (INDEPENDENT_AMBULATORY_CARE_PROVIDER_SITE_OTHER): Payer: Self-pay

## 2016-04-12 VITALS — BP 144/89 | HR 85 | Temp 97.4°F | Ht 65.0 in | Wt 157.0 lb

## 2016-04-12 DIAGNOSIS — I10 Essential (primary) hypertension: Secondary | ICD-10-CM | POA: Diagnosis not present

## 2016-04-12 MED ORDER — LOSARTAN POTASSIUM 50 MG PO TABS: 50.0000 mg | ORAL_TABLET | Freq: Every day | ORAL | Status: DC

## 2016-04-12 NOTE — Progress Notes (Signed)
Subjective:    Patient ID: Roberto Kennedy, male    DOB: Sep 04, 1971, 45 y.o.   MRN: [id number]  HPI Patient here today for refill on medication. He will schedule a CPE later in the year.The patient is doing well overall. He denies any chest pain shortness of breath problems with her stoma or musculoskeletal system. He is not having any problems passing his water. His family history is significant in that his dad is 37 and has had colon cancer and has had a stroke. He is the middle of 3 boys in both of his brothers one older one younger are overweight and both have hypertension. The patient indicates that he is put on some weight recently and we'll work to lose this. He will continue to watch his sodium intake. He says his blood pressures at home run similar to what they have been running here that more than the 130s over the 80s. He denies any chest pain or shortness of breath. He has no GI symptoms. He is passing his water without problems.      Patient Active Problem List   Diagnosis Date Noted  . Hypertension 07/07/2013  . Allergic rhinitis 07/07/2013   Outpatient Encounter Prescriptions as of 04/12/2016  Medication Sig  . losartan (COZAAR) 50 MG tablet TAKE 1 TABLET BY MOUTH EVERY DAY  . Azelastine HCl 0.15 % SOLN USE 1 SPRAY IN NOSTRIL(S) EVERY DAY - 100 DAY SUPPLY (Patient not taking: Reported on 04/12/2016)  . fluticasone (FLONASE) 50 MCG/ACT nasal spray One to 2 sprays each nostril at bedtime (Patient not taking: Reported on 04/12/2016)  . [DISCONTINUED] amoxicillin-clavulanate (AUGMENTIN) 875-125 MG per tablet Take 1 tablet by mouth 2 (two) times daily.   No facility-administered encounter medications on file as of 04/12/2016.     Review of Systems  Constitutional: Negative.   HENT: Negative.   Eyes: Negative.   Respiratory: Negative.   Cardiovascular: Negative.   Gastrointestinal: Negative.   Endocrine: Negative.   Genitourinary: Negative.   Musculoskeletal: Negative.   Skin:  Negative.   Allergic/Immunologic: Negative.   Neurological: Negative.   Hematological: Negative.   Psychiatric/Behavioral: Negative.        Objective:   Physical Exam  Constitutional: He is oriented to person, place, and time. He appears well-developed and well-nourished.  HENT:  Head: Normocephalic and atraumatic.  Right Ear: External ear normal.  Left Ear: External ear normal.  Nose: Nose normal.  Mouth/Throat: Oropharynx is clear and moist. No oropharyngeal exudate.  Eyes: Conjunctivae and EOM are normal. Pupils are equal, round, and reactive to light. Right eye exhibits no discharge. Left eye exhibits no discharge. No scleral icterus.  Neck: Normal range of motion. Neck supple. No thyromegaly present.  Cardiovascular: Normal rate, regular rhythm and normal heart sounds.   No murmur heard. Pulmonary/Chest: Effort normal and breath sounds normal. No respiratory distress. He has no wheezes. He has no rales. He exhibits no tenderness.  Abdominal: Soft. Bowel sounds are normal. He exhibits no mass. There is no tenderness. There is no rebound and no guarding.  Musculoskeletal: Normal range of motion. He exhibits no edema.  Lymphadenopathy:    He has no cervical adenopathy.  Neurological: He is alert and oriented to person, place, and time.  Skin: Skin is warm and dry. No rash noted.  Psychiatric: He has a normal mood and affect. His behavior is normal. Judgment and thought content normal.  Nursing note and vitals reviewed.  BP 144/89 mmHg  Pulse 85  Temp(Src)  97.4 F (36.3 C) (Oral)  Ht '5\' 5"'  (1.651 m)  Wt 157 lb (71.215 kg)  BMI 26.13 kg/m2        Assessment & Plan:  1. Essential hypertension -Continue current medication and bring readings by a blood pressures at home in 2-3 weeks -If the blood pressures remain elevated we will consider increasing the lowsartin to 100 mg instead of 50 -Work on weight loss and sodium restriction - BMP8+EGFR - CBC with  Differential/Platelet - Hepatic function panel - Lipid panel - losartan (COZAAR) 50 MG tablet; Take 1 tablet (50 mg total) by mouth daily.  Dispense: 90 tablet; Refill: 3  Patient Instructions  Continue current medications. Continue good therapeutic lifestyle changes which include good diet and exercise. Fall precautions discussed with patient. If an FOBT was given today- please return it to our front desk. If you are over 88 years old - you may need Prevnar 77 or the adult Pneumonia vaccine.  **Flu shots are available--- please call and schedule a FLU-CLINIC appointment**  After your visit with [place] today you will receive a survey in the mail or online from [employer] regarding your care with [place]. Please take a moment to fill this out. Your feedback is very important to [place] as you can help [place] better understand your patient needs as well as improve your experience and satisfaction. WE CARE ABOUT YOU!!!   Monitor blood pressures at home and bring readings by and 2-3 weeks for review Continue to take current medicine Work aggressively on weight loss and sodium restriction We will call with lab work results as soon as those results become available   Arrie Senate MD

## 2016-04-12 NOTE — Patient Instructions (Addendum)
Continue current medications. Continue good therapeutic lifestyle changes which include good diet and exercise. Fall precautions discussed with patient. If an FOBT was given today- please return it to our front desk. If you are over 46 years old - you may need Prevnar 31 or the adult Pneumonia vaccine.  **Flu shots are available--- please call and schedule a FLU-CLINIC appointment**  After your visit with [place] today you will receive a survey in the mail or online from [employer] regarding your care with [place]. Please take a moment to fill this out. Your feedback is very important to [place] as you can help [place] better understand your patient needs as well as improve your experience and satisfaction. WE CARE ABOUT YOU!!!   Monitor blood pressures at home and bring readings by and 2-3 weeks for review Continue to take current medicine Work aggressively on weight loss and sodium restriction We will call with lab work results as soon as those results become available

## 2016-04-13 LAB — CBC WITH DIFFERENTIAL/PLATELET
Basophils Absolute: 0 10*3/uL (ref 0.0–0.2)
Basos: 0 %
EOS (ABSOLUTE): 0.1 10*3/uL (ref 0.0–0.4)
EOS: 2 %
HEMATOCRIT: 45.8 % (ref 37.5–51.0)
HEMOGLOBIN: 15.5 g/dL (ref 12.6–17.7)
Immature Grans (Abs): 0 10*3/uL (ref 0.0–0.1)
Immature Granulocytes: 0 %
LYMPHS ABS: 2.1 10*3/uL (ref 0.7–3.1)
Lymphs: 36 %
MCH: 31 pg (ref 26.6–33.0)
MCHC: 33.8 g/dL (ref 31.5–35.7)
MCV: 92 fL (ref 79–97)
MONOCYTES: 10 %
Monocytes Absolute: 0.6 10*3/uL (ref 0.1–0.9)
NEUTROS ABS: 3 10*3/uL (ref 1.4–7.0)
Neutrophils: 52 %
Platelets: 254 10*3/uL (ref 150–379)
RBC: 5 x10E6/uL (ref 4.14–5.80)
RDW: 13.2 % (ref 12.3–15.4)
WBC: 5.9 10*3/uL (ref 3.4–10.8)

## 2016-04-13 LAB — HEPATIC FUNCTION PANEL
ALK PHOS: 60 IU/L (ref 39–117)
ALT: 23 IU/L (ref 0–44)
AST: 28 IU/L (ref 0–40)
Albumin: 5.2 g/dL (ref 3.5–5.5)
Bilirubin Total: 0.5 mg/dL (ref 0.0–1.2)
Bilirubin, Direct: 0.14 mg/dL (ref 0.00–0.40)
Total Protein: 7.5 g/dL (ref 6.0–8.5)

## 2016-04-13 LAB — LIPID PANEL
CHOL/HDL RATIO: 2.2 ratio (ref 0.0–5.0)
CHOLESTEROL TOTAL: 182 mg/dL (ref 100–199)
HDL: 83 mg/dL (ref >39)
LDL Calculated: 88 mg/dL (ref 0–99)
TRIGLYCERIDES: 53 mg/dL (ref 0–149)
VLDL Cholesterol Cal: 11 mg/dL (ref 5–40)

## 2016-04-13 LAB — BMP8+EGFR
BUN / CREAT RATIO: 15 (ref 9–20)
BUN: 13 mg/dL (ref 6–24)
CO2: 26 mmol/L (ref 18–29)
CREATININE: 0.89 mg/dL (ref 0.76–1.27)
Calcium: 9.9 mg/dL (ref 8.7–10.2)
Chloride: 98 mmol/L (ref 96–106)
GFR calc non Af Amer: 103 mL/min/{1.73_m2} (ref >59)
GFR, EST AFRICAN AMERICAN: 119 mL/min/{1.73_m2} (ref >59)
Glucose: 93 mg/dL (ref 65–99)
Potassium: 4.6 mmol/L (ref 3.5–5.2)
Sodium: 141 mmol/L (ref 134–144)

## 2016-07-03 ENCOUNTER — Encounter: Payer: Self-pay | Admitting: Internal Medicine

## 2016-08-10 ENCOUNTER — Telehealth: Payer: Self-pay | Admitting: Family Medicine

## 2016-08-10 NOTE — Telephone Encounter (Signed)
Labs printed and mailed.

## 2016-10-10 ENCOUNTER — Telehealth: Payer: Self-pay | Admitting: Family Medicine

## 2016-10-10 ENCOUNTER — Ambulatory Visit (INDEPENDENT_AMBULATORY_CARE_PROVIDER_SITE_OTHER): Payer: PRIVATE HEALTH INSURANCE | Admitting: Family Medicine

## 2016-10-10 ENCOUNTER — Encounter: Payer: Self-pay | Admitting: Family Medicine

## 2016-10-10 VITALS — BP 128/83 | HR 79 | Temp 97.7°F | Ht 65.0 in | Wt 152.2 lb

## 2016-10-10 DIAGNOSIS — L989 Disorder of the skin and subcutaneous tissue, unspecified: Secondary | ICD-10-CM

## 2016-10-10 NOTE — Progress Notes (Signed)
   Subjective:    Patient ID: Roberto Kennedy, male    DOB: Nov 16, 1971, 45 y.o.   MRN: [id number]  HPI  Pt is here today for a skin lesion on his R upper chest that he noticed 6 weeks ago.  Pt also has a cystic lesion on his L forearm that he noticed 2 weeks ago.    Review of Systems  Skin: Positive for wound (L forearm x 2 wks).              Objective:   Physical Exam  Constitutional: He appears well-developed and well-nourished. No distress.  HENT:  Head: Normocephalic.  Eyes: Conjunctivae and EOM are normal. Pupils are equal, round, and reactive to light. Right eye exhibits no discharge. Left eye exhibits no discharge. No scleral icterus.  Neck: Normal range of motion.  Skin: Skin is warm and dry.  2 skin lesions are of concern one on the chest which may be a seborrheic keratosis. Another on the left forearm which is raised shiny and glistening. This has come up and if rapid manner.  Psychiatric: He has a normal mood and affect. His behavior is normal. Judgment and thought content normal.  Nursing note and vitals reviewed.  BP 135/89 (BP Location: Left Arm, Patient Position: Sitting, Cuff Size: Normal)   Pulse 77   Temp 97.7 F (36.5 C) (Oral)   Ht 5\' 5"  (1.651 m)   Wt 152 lb 3.2 oz (69 kg)   BMI 25.33 kg/m         Assessment & Plan:  1. Skin lesion of left arm -We will arrange for patient to see [facility] dermatology  Patient Instructions  We will get an appointment as soon as possible with the dermatologist  Arrie Senate MD

## 2016-10-10 NOTE — Telephone Encounter (Signed)
Last office note faxed to [facility].

## 2016-10-10 NOTE — Patient Instructions (Signed)
We will get an appointment as soon as possible with the dermatologist

## 2017-04-23 ENCOUNTER — Other Ambulatory Visit: Payer: Self-pay | Admitting: *Deleted

## 2017-04-23 DIAGNOSIS — R7989 Other specified abnormal findings of blood chemistry: Secondary | ICD-10-CM

## 2017-04-23 DIAGNOSIS — R945 Abnormal results of liver function studies: Principal | ICD-10-CM

## 2017-04-23 DIAGNOSIS — I10 Essential (primary) hypertension: Secondary | ICD-10-CM

## 2017-04-23 MED ORDER — LOSARTAN POTASSIUM 50 MG PO TABS: 50.0000 mg | ORAL_TABLET | Freq: Every day | ORAL | 3 refills | Status: DC

## 2017-07-24 ENCOUNTER — Other Ambulatory Visit: Payer: PRIVATE HEALTH INSURANCE

## 2017-07-24 DIAGNOSIS — E7800 Pure hypercholesterolemia, unspecified: Secondary | ICD-10-CM

## 2017-07-25 LAB — HEPATIC FUNCTION PANEL
ALT: 30 IU/L (ref 0–44)
AST: 34 IU/L (ref 0–40)
Albumin: 5.3 g/dL (ref 3.5–5.5)
Alkaline Phosphatase: 63 IU/L (ref 39–117)
Bilirubin Total: 0.6 mg/dL (ref 0.0–1.2)
Bilirubin, Direct: 0.2 mg/dL (ref 0.00–0.40)
Total Protein: 7.5 g/dL (ref 6.0–8.5)

## 2018-05-08 ENCOUNTER — Encounter: Payer: Self-pay | Admitting: Family Medicine

## 2018-06-24 ENCOUNTER — Other Ambulatory Visit: Payer: Self-pay | Admitting: Family Medicine

## 2018-06-24 DIAGNOSIS — I10 Essential (primary) hypertension: Secondary | ICD-10-CM

## 2018-07-30 ENCOUNTER — Other Ambulatory Visit: Payer: Self-pay | Admitting: Family Medicine

## 2018-07-30 DIAGNOSIS — I10 Essential (primary) hypertension: Secondary | ICD-10-CM

## 2018-07-30 MED ORDER — LOSARTAN POTASSIUM 50 MG PO TABS: 50.0000 mg | ORAL_TABLET | Freq: Every day | ORAL | 0 refills | Status: DC

## 2018-08-27 ENCOUNTER — Ambulatory Visit (INDEPENDENT_AMBULATORY_CARE_PROVIDER_SITE_OTHER): Payer: PRIVATE HEALTH INSURANCE

## 2018-08-27 ENCOUNTER — Encounter: Payer: Self-pay | Admitting: Family Medicine

## 2018-08-27 ENCOUNTER — Ambulatory Visit (INDEPENDENT_AMBULATORY_CARE_PROVIDER_SITE_OTHER): Payer: PRIVATE HEALTH INSURANCE | Admitting: Family Medicine

## 2018-08-27 VITALS — BP 139/90 | HR 73 | Temp 97.5°F | Ht 65.0 in | Wt 146.0 lb

## 2018-08-27 DIAGNOSIS — Z0000 Encounter for general adult medical examination without abnormal findings: Secondary | ICD-10-CM

## 2018-08-27 DIAGNOSIS — C44619 Basal cell carcinoma of skin of left upper limb, including shoulder: Secondary | ICD-10-CM | POA: Insufficient documentation

## 2018-08-27 DIAGNOSIS — B078 Other viral warts: Secondary | ICD-10-CM

## 2018-08-27 DIAGNOSIS — R945 Abnormal results of liver function studies: Secondary | ICD-10-CM

## 2018-08-27 DIAGNOSIS — Z1283 Encounter for screening for malignant neoplasm of skin: Secondary | ICD-10-CM

## 2018-08-27 DIAGNOSIS — I10 Essential (primary) hypertension: Secondary | ICD-10-CM | POA: Diagnosis not present

## 2018-08-27 DIAGNOSIS — E559 Vitamin D deficiency, unspecified: Secondary | ICD-10-CM

## 2018-08-27 DIAGNOSIS — Z800 Family history of malignant neoplasm of digestive organs: Secondary | ICD-10-CM

## 2018-08-27 DIAGNOSIS — R7989 Other specified abnormal findings of blood chemistry: Secondary | ICD-10-CM

## 2018-08-27 MED ORDER — IMIQUIMOD 5 % EX CREA: TOPICAL_CREAM | Freq: Every day | CUTANEOUS | 0 refills | Status: DC

## 2018-08-27 MED ORDER — LOSARTAN POTASSIUM 50 MG PO TABS: 50.0000 mg | ORAL_TABLET | Freq: Every day | ORAL | 3 refills | Status: DC

## 2018-08-27 NOTE — Patient Instructions (Addendum)
Continue current medications. Continue good therapeutic lifestyle changes which include good diet and exercise. Fall precautions discussed with patient. If an FOBT was given today- please return it to our front desk. If you are over 48 years old - you may need Prevnar 54 or the adult Pneumonia vaccine.  **Flu shots are available--- please call and schedule a FLU-CLINIC appointment**  After your visit with [place] today you will receive a survey in the mail or online from [employer] regarding your care with [place]. Please take a moment to fill this out. Your feedback is very important to [place] as you can help [place] better understand your patient needs as well as improve your experience and satisfaction. WE CARE ABOUT YOU!!!   We will arrange for you to see the gastroenterologist to have a follow-up colonoscopy because of your father having had colon cancer.  You are actually a couple years past due this. We will also arrange for you to go back and see the dermatologist Please check blood pressures more regularly Bring blood pressure readings by in 4 to 6 weeks and have the nurse to check her blood pressure here Watch sodium intake Get eye exams regularly

## 2018-08-27 NOTE — Progress Notes (Signed)
Subjective:    Patient ID: Roberto Kennedy, male    DOB: [birth year], 47 y.o.   MRN: [id number]  HPI Patient is here today for annual wellness exam and follow up of chronic medical problems which includes hypertension. He is taking medication regularly.  The patient is doing well overall and is concerned today about some skin lesions.  His initial blood pressure was elevated and the repeat blood pressure was better at 139/90.  We will make sure that he is checking readings at home.  He is due for complete physical today.  He brings lab work in with him for review.  This was done in May 2019.  All of the electrolytes were good except the uric acid was elevated at 8.2 and should be closer to or below 7.2.  He still has a liver function test that is elevated and this is the LDH at 199.  I think this is been there and like this before but we will double check that from previous lab work.  Cholesterol numbers have an HDL that is excellent at 73.  The LDL is good at 86.  Total cholesterol is good at 179 and triglycerides are good at 104.  PSA is good at 0.1.  All of the electrolytes including potassium are good and the serum iron is normal.  The CBC was also within normal limits with the white blood cell count not being elevated and hemoglobin being good at 15.5 and the platelet count being good at 237,000.  The patient is currently taking losartan 50 mg daily.  The patient is pleasant and alert.  It is significant to note that his father had colon cancer.  The patient has home had one colonoscopy and this was done in May 2012 and so is a couple years past due on repeating his colonoscopy.  He did not realize it has been that long and we will schedule him to have a repeat colonoscopy soon as we can get him in to follow-up on that.  He denies any chest pain or shortness of breath.  He denies any other problems with his intestinal tract including nausea vomiting swallowing heartburn blood in the stool or black tarry  bowel movements.  He is past due was read [facility] colonoscopy.  He is passing his water well with no complaints.  He has no erectile dysfunction.  He has not been back to see the dermatologist since he had the basal cell cancer removed from the left forearm.  Make sure that he gets a follow-up visit seeing the dermatologist.   Patient Active Problem List   Diagnosis Date Noted  . Hypertension 07/07/2013  . Allergic rhinitis 07/07/2013   Outpatient Encounter Medications as of 08/27/2018  Medication Sig  . losartan (COZAAR) 50 MG tablet Take 1 tablet (50 mg total) by mouth daily.  . [DISCONTINUED] Azelastine HCl 0.15 % SOLN USE 1 SPRAY IN NOSTRIL(S) EVERY DAY - 100 DAY SUPPLY (Patient not taking: Reported on 10/10/2016)  . [DISCONTINUED] fluticasone (FLONASE) 50 MCG/ACT nasal spray One to 2 sprays each nostril at bedtime (Patient not taking: Reported on 10/10/2016)   No facility-administered encounter medications on file as of 08/27/2018.       Review of Systems  Constitutional: Negative.   HENT: Negative.   Eyes: Negative.   Respiratory: Negative.   Cardiovascular: Negative.   Gastrointestinal: Negative.   Endocrine: Negative.   Genitourinary: Negative.   Musculoskeletal: Negative.   Skin: Negative.  Skin lesion   Allergic/Immunologic: Negative.   Neurological: Negative.   Hematological: Negative.   Psychiatric/Behavioral: Negative.        Objective:   Physical Exam  Constitutional: He is oriented to person, place, and time. He appears well-developed and well-nourished. No distress.  The patient is pleasant and relaxed.  HENT:  Head: Normocephalic and atraumatic.  Right Ear: External ear normal.  Left Ear: External ear normal.  Nose: Nose normal.  Mouth/Throat: Oropharynx is clear and moist. No oropharyngeal exudate.  Eyes: Pupils are equal, round, and reactive to light. Conjunctivae and EOM are normal. Right eye exhibits no discharge. Left eye exhibits no discharge. No  scleral icterus.  Neck: Normal range of motion. Neck supple. No thyromegaly present.  No bruits thyromegaly or anterior cervical adenopathy  Cardiovascular: Normal rate, regular rhythm, normal heart sounds and intact distal pulses.  No murmur heard. Heart is 72/min with a regular rate and rhythm and no murmurs  Pulmonary/Chest: Effort normal and breath sounds normal. He has no wheezes. He has no rales. He exhibits no tenderness.  Clear anteriorly and posteriorly and no axillary adenopathy  Abdominal: Soft. Bowel sounds are normal. He exhibits no mass. There is no tenderness.  No abdominal tenderness masses organ enlargement or bruits and no inguinal adenopathy  Genitourinary: Rectum normal and penis normal.  Genitourinary Comments: Prostate is slightly enlarged and slightly asymmetric with the left side being slightly larger than the right.  The prostate was smooth without any lumps or masses.  There were no rectal masses.  The external genitalia were within normal limits and the testicles were normal.  The skin on the left testicle appears to have a couple of skin lesions which may be venereal warts.  The patient says they have been there for maybe 6 months.  Musculoskeletal: Normal range of motion. He exhibits no edema.  Lymphadenopathy:    He has no cervical adenopathy.  Neurological: He is alert and oriented to person, place, and time. He has normal reflexes. No cranial nerve deficit.  Skin: Skin is warm and dry. No rash noted.  Warty type growth on left testicle.  No unusual skin lesions noted but patient needs to go back for follow-up because of the previous basal cell carcinoma.  He should schedule yearly visits.  Psychiatric: He has a normal mood and affect. His behavior is normal. Judgment and thought content normal.  The patient's mood affect and behavior were all normal for him.  Nursing note and vitals reviewed.  BP 139/90   Pulse 73   Temp (!) 97.5 F (36.4 C) (Oral)   Ht 5\' 5"   (1.651 m)   Wt 146 lb (66.2 kg)   BMI 24.30 kg/m         Assessment & Plan:  1. Annual physical exam -Arrange for patient to have colonoscopy -Arrange for patient to have dermatology check because of basal cell carcinoma - Urinalysis, Complete  2. Essential hypertension -Blood pressure today is somewhat borderline and patient should check blood pressures more regularly and come by the office in 4 to 6 weeks for recheck with blood pressure readings from home. - DG Chest 2 View; Future - losartan (COZAAR) 50 MG tablet; Take 1 tablet (50 mg total) by mouth daily.  Dispense: 90 tablet; Refill: 3 - Hepatic function panel  3. Basal cell carcinoma, arm, left -Schedule patient for dermatology check for which she is past due  4. Family history of colon cancer -Schedule patient for colonoscopy which is  past due  5. Elevated liver function tests -Repeat liver function test today  6. Vitamin D deficiency - VITAMIN D 25 Hydroxy (Vit-D Deficiency, Fractures)  7. Other viral warts -Aldara cream, use as directed for 2 weeks  Meds ordered this encounter  Medications  . losartan (COZAAR) 50 MG tablet    Sig: Take 1 tablet (50 mg total) by mouth daily.    Dispense:  90 tablet    Refill:  3  . imiquimod (ALDARA) 5 % cream    Sig: Apply topically at bedtime. For 2 weeks    Dispense:  14 each    Refill:  0   Patient Instructions  Continue current medications. Continue good therapeutic lifestyle changes which include good diet and exercise. Fall precautions discussed with patient. If an FOBT was given today- please return it to our front desk. If you are over 77 years old - you may need Prevnar 47 or the adult Pneumonia vaccine.  **Flu shots are available--- please call and schedule a FLU-CLINIC appointment**  After your visit with [place] today you will receive a survey in the mail or online from [employer] regarding your care with [place]. Please take a moment to fill this out. Your  feedback is very important to [place] as you can help [place] better understand your patient needs as well as improve your experience and satisfaction. WE CARE ABOUT YOU!!!   We will arrange for you to see the gastroenterologist to have a follow-up colonoscopy because of your father having had colon cancer.  You are actually a couple years past due this. We will also arrange for you to go back and see the dermatologist Please check blood pressures more regularly Bring blood pressure readings by in 4 to 6 weeks and have the nurse to check her blood pressure here Watch sodium intake Get eye exams regularly  / Arrie Senate MD

## 2018-08-28 LAB — MICROSCOPIC EXAMINATION
BACTERIA UA: NONE SEEN
CASTS: NONE SEEN /LPF

## 2018-08-28 LAB — URINALYSIS, COMPLETE
BILIRUBIN UA: NEGATIVE
Glucose, UA: NEGATIVE
Leukocytes, UA: NEGATIVE
Nitrite, UA: NEGATIVE
Protein, UA: NEGATIVE
RBC UA: NEGATIVE
Specific Gravity, UA: 1.026 (ref 1.005–1.030)
UUROB: 1 mg/dL (ref 0.2–1.0)
pH, UA: 6.5 (ref 5.0–7.5)

## 2018-08-28 LAB — VITAMIN D 25 HYDROXY (VIT D DEFICIENCY, FRACTURES): Vit D, 25-Hydroxy: 45.3 ng/mL (ref 30.0–100.0)

## 2018-08-28 LAB — HEPATIC FUNCTION PANEL
ALBUMIN: 4.8 g/dL (ref 3.5–5.5)
ALT: 30 IU/L (ref 0–44)
AST: 33 IU/L (ref 0–40)
Alkaline Phosphatase: 52 IU/L (ref 39–117)
BILIRUBIN TOTAL: 0.4 mg/dL (ref 0.0–1.2)
Bilirubin, Direct: 0.18 mg/dL (ref 0.00–0.40)
TOTAL PROTEIN: 6.9 g/dL (ref 6.0–8.5)

## 2018-09-24 ENCOUNTER — Telehealth: Payer: Self-pay | Admitting: Family Medicine

## 2018-09-25 MED ORDER — IMIQUIMOD 5 % EX CREA: TOPICAL_CREAM | Freq: Every day | CUTANEOUS | 2 refills | Status: DC

## 2018-09-25 NOTE — Telephone Encounter (Signed)
Please advise on refill.

## 2018-09-25 NOTE — Telephone Encounter (Signed)
This may be refilled 

## 2018-10-06 ENCOUNTER — Other Ambulatory Visit: Payer: Self-pay | Admitting: *Deleted

## 2018-12-01 ENCOUNTER — Encounter: Payer: Self-pay | Admitting: Family Medicine

## 2019-06-05 IMAGING — DX DG CHEST 2V
2 series · 2 of 2 positions shown · non-contrast
Comparison: 07/13/2014

CLINICAL DATA: 47-year-old with essential hypertension.

EXAM:
CHEST - 2 VIEW

[chest pa]
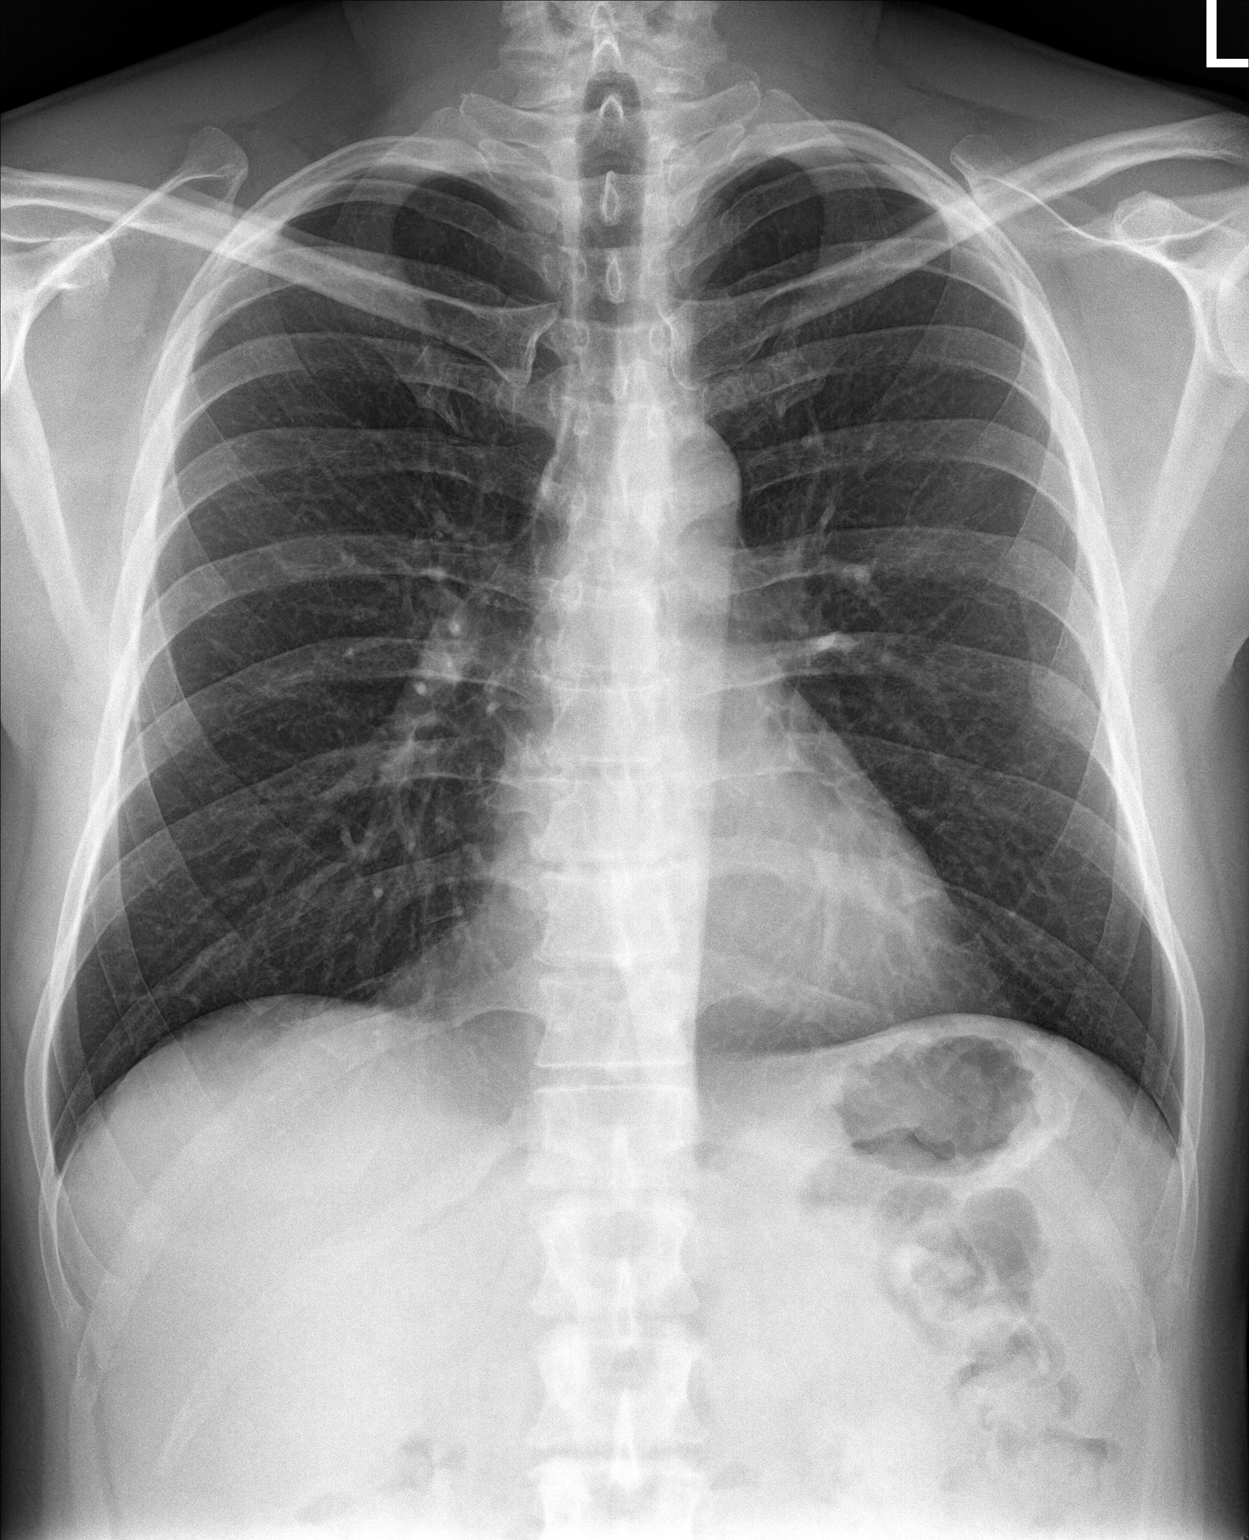

[chest lat]
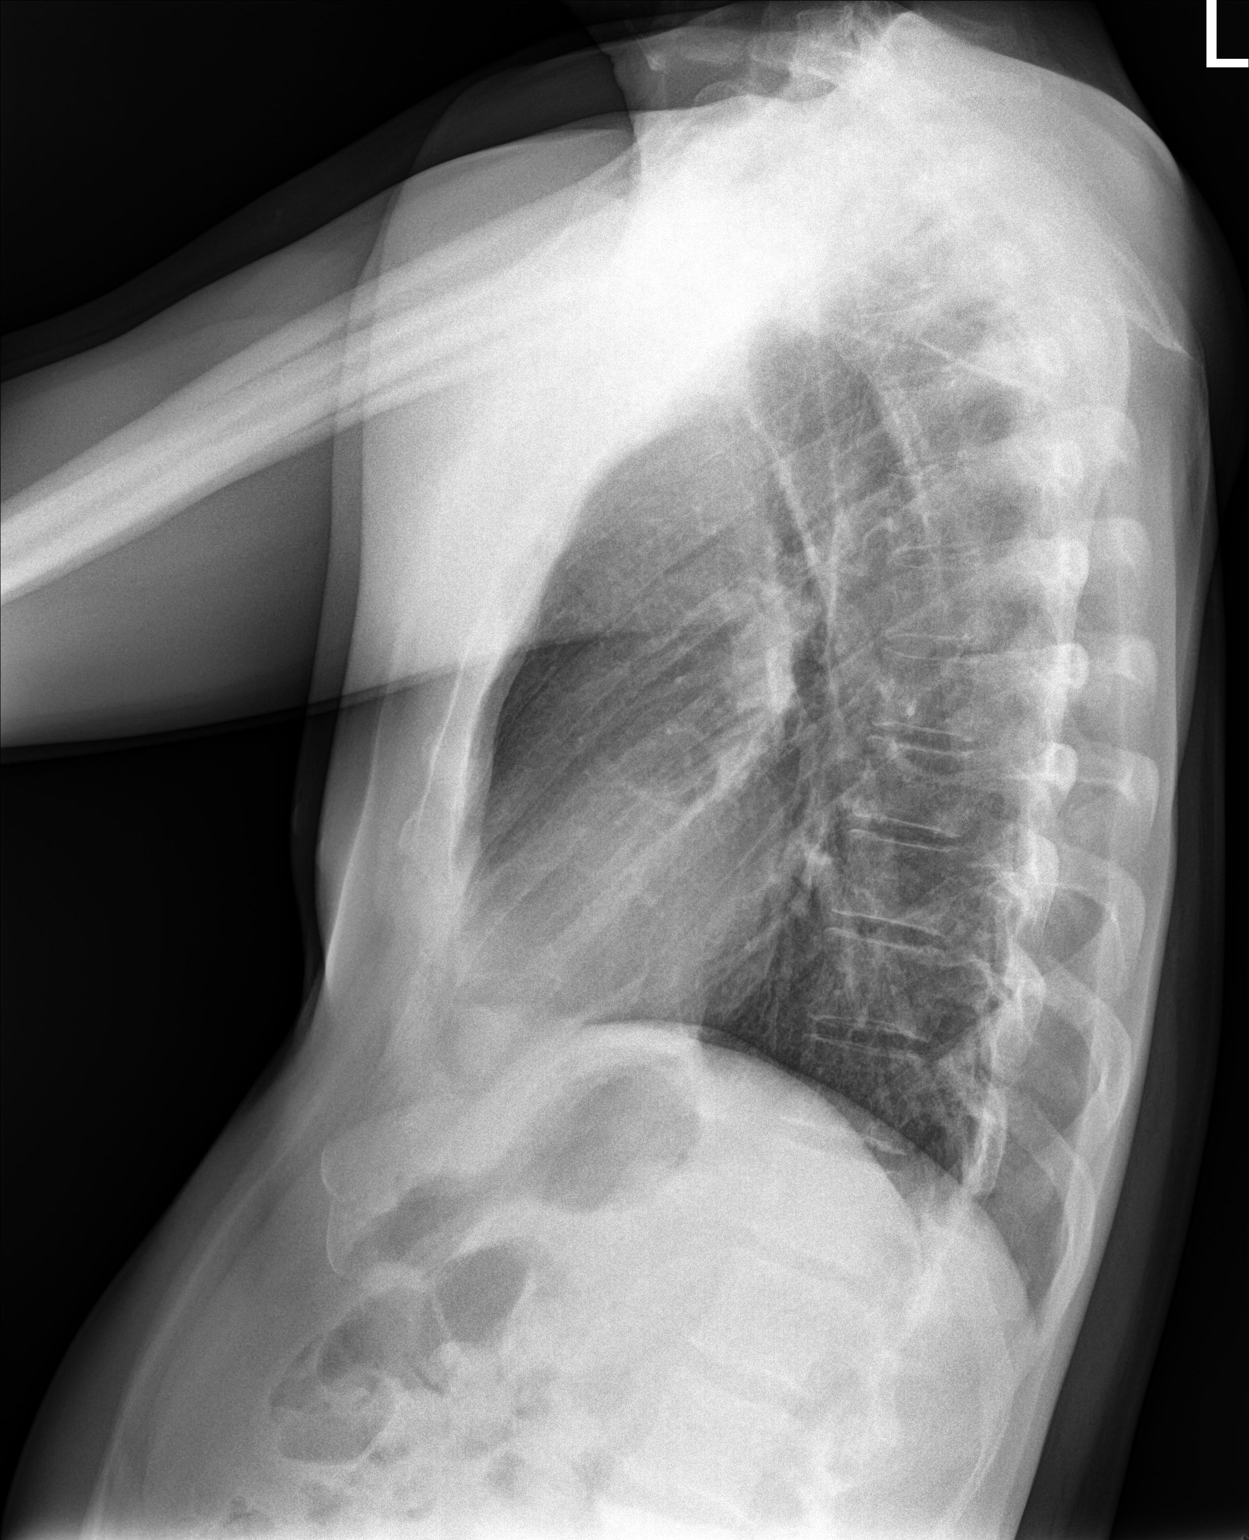

[2 of 2 positions shown; findings below may reference images not displayed]

FINDINGS: Both lungs are clear. Heart and mediastinum are within normal
limits. Trachea is midline. No large pleural effusions. Bone
structures are unremarkable.
IMPRESSION: No active cardiopulmonary disease.

## 2019-07-10 ENCOUNTER — Telehealth: Payer: Self-pay | Admitting: Family Medicine

## 2019-07-10 ENCOUNTER — Encounter: Payer: Self-pay | Admitting: *Deleted

## 2019-08-17 ENCOUNTER — Encounter: Payer: Self-pay | Admitting: Family Medicine

## 2019-08-17 ENCOUNTER — Ambulatory Visit (INDEPENDENT_AMBULATORY_CARE_PROVIDER_SITE_OTHER): Payer: PRIVATE HEALTH INSURANCE | Admitting: Family Medicine

## 2019-08-17 DIAGNOSIS — T63691A Toxic effect of contact with other venomous marine animals, accidental (unintentional), initial encounter: Secondary | ICD-10-CM

## 2019-08-17 MED ORDER — CIPROFLOXACIN HCL 500 MG PO TABS: 500.0000 mg | ORAL_TABLET | Freq: Two times a day (BID) | ORAL | 0 refills | Status: DC

## 2019-08-17 NOTE — Progress Notes (Signed)
    Subjective:    Patient ID: Roberto Kennedy, male    DOB: [birth year], 48 y.o.   MRN: [id number]   HPI: Roberto Kennedy is a 48 y.o. male presenting for stung bby sting ray while surf fishing two days ago. Hit by its tail barb while trying to remove him from the hook to throw it back. Experienced the worst pain of his life. That has died down some and is manageable, but the site is quite red and swollen. Depression screen [facility] 2/9 08/27/2018 10/10/2016 04/12/2016 05/05/2014  Decreased Interest 0 0 0 0  Down, Depressed, Hopeless 0 0 0 0  PHQ - 2 Score 0 0 0 0     Relevant past medical, surgical, family and social history reviewed and updated as indicated.  Interim medical history since our last visit reviewed. Allergies and medications reviewed and updated.  ROS:  Review of Systems  Constitutional: Positive for fatigue. Negative for activity change and fever.  Musculoskeletal: Positive for myalgias.     Social History   Tobacco Use  Smoking Status Never Smoker  Smokeless Tobacco Never Used       Objective:     Wt Readings from Last 3 Encounters:  08/27/18 146 lb (66.2 kg)  10/10/16 152 lb 3.2 oz (69 kg)  04/12/16 157 lb (71.2 kg)     Exam deferred. Pt. Harboring due to COVID 19. Phone visit performed.   Assessment & Plan:   1. Marine animal sting, accidental or unintentional, initial encounter     Meds ordered this encounter  Medications  . ciprofloxacin (CIPRO) 500 MG tablet    Sig: Take 1 tablet (500 mg total) by mouth 2 (two) times daily.    Dispense:  20 tablet    Refill:  0    No orders of the defined types were placed in this encounter.     Diagnoses and all orders for this visit:  Marine animal sting, accidental or unintentional, initial encounter  Other orders -     ciprofloxacin (CIPRO) 500 MG tablet; Take 1 tablet (500 mg total) by mouth 2 (two) times daily.    Virtual Visit via telephone Note  I discussed the limitations, risks, security and  privacy concerns of performing an evaluation and management service by telephone and the availability of in person appointments. The patient was identified with two identifiers. Pt.expressed understanding and agreed to proceed. Pt. Is at home. Dr. Livia Snellen is in his office.  Follow Up Instructions:   I discussed the assessment and treatment plan with the patient. The patient was provided an opportunity to ask questions and all were answered. The patient agreed with the plan and demonstrated an understanding of the instructions.   The patient was advised to call back or seek an in-person evaluation if the symptoms worsen or if the condition fails to improve as anticipated.   Total minutes including chart review and phone contact time: 16   Follow up plan: Return in about 2 days (around 08/19/2019), or if symptoms worsen or fail to improve.  Claretta Fraise, MD [place]

## 2019-08-18 ENCOUNTER — Ambulatory Visit: Payer: PRIVATE HEALTH INSURANCE | Admitting: Family Medicine

## 2019-09-01 ENCOUNTER — Ambulatory Visit: Payer: PRIVATE HEALTH INSURANCE | Admitting: Family Medicine

## 2019-09-02 ENCOUNTER — Encounter: Payer: PRIVATE HEALTH INSURANCE | Admitting: Family Medicine

## 2019-09-16 ENCOUNTER — Other Ambulatory Visit: Payer: Self-pay

## 2019-09-17 ENCOUNTER — Ambulatory Visit (INDEPENDENT_AMBULATORY_CARE_PROVIDER_SITE_OTHER): Payer: PRIVATE HEALTH INSURANCE | Admitting: Family Medicine

## 2019-09-17 ENCOUNTER — Encounter: Payer: Self-pay | Admitting: Family Medicine

## 2019-09-17 VITALS — BP 129/82 | HR 79 | Temp 98.0°F | Resp 20 | Ht 65.0 in | Wt 152.6 lb

## 2019-09-17 DIAGNOSIS — Z0000 Encounter for general adult medical examination without abnormal findings: Secondary | ICD-10-CM

## 2019-09-17 DIAGNOSIS — E739 Lactose intolerance, unspecified: Secondary | ICD-10-CM

## 2019-09-17 DIAGNOSIS — Z1211 Encounter for screening for malignant neoplasm of colon: Secondary | ICD-10-CM | POA: Diagnosis not present

## 2019-09-17 DIAGNOSIS — Z125 Encounter for screening for malignant neoplasm of prostate: Secondary | ICD-10-CM

## 2019-09-17 DIAGNOSIS — Z0001 Encounter for general adult medical examination with abnormal findings: Secondary | ICD-10-CM | POA: Diagnosis not present

## 2019-09-17 DIAGNOSIS — I10 Essential (primary) hypertension: Secondary | ICD-10-CM

## 2019-09-17 MED ORDER — LOSARTAN POTASSIUM 50 MG PO TABS: 50.0000 mg | ORAL_TABLET | Freq: Every day | ORAL | 3 refills | Status: DC

## 2019-09-17 NOTE — Progress Notes (Signed)
Subjective:  Patient ID: Roberto Kennedy, male    DOB: [birth year]  Age: 48 y.o. MRN: [id number]  CC: Annual Exam   HPI [name] presents for Complete physical as well as  follow-up of hypertension. Patient has no history of headache chest pain or shortness of breath or recent cough. Patient also denies symptoms of TIA such as focal numbness or weakness. Patient denies side effects from medication. States taking it regularly.   History [name] has a past medical history of Allergy and Hypertension.   He has a past surgical history that includes Colonoscopy (05/09/2011).   His family history includes Colon cancer (age of onset: 79) in his father.He reports that he has never smoked. He has never used smokeless tobacco. He reports current alcohol use of about 1.0 standard drinks of alcohol per week. He reports that he does not use drugs.  No current outpatient medications on file prior to visit.   No current facility-administered medications on file prior to visit.     ROS Review of Systems  Constitutional: Negative for activity change, fatigue and unexpected weight change.  HENT: Negative for congestion, ear pain, hearing loss, postnasal drip and trouble swallowing.   Eyes: Negative for pain and visual disturbance.  Respiratory: Negative for cough, chest tightness and shortness of breath.   Cardiovascular: Negative for chest pain, palpitations and leg swelling.  Gastrointestinal: Negative for abdominal distention, abdominal pain, blood in stool, constipation, diarrhea, nausea and vomiting.  Endocrine: Negative for cold intolerance, heat intolerance and polydipsia.  Genitourinary: Negative for difficulty urinating, dysuria, flank pain, frequency and urgency.  Musculoskeletal: Negative for arthralgias and joint swelling.  Skin: Negative for color change, rash and wound.  Neurological: Negative for dizziness, syncope, speech difficulty, weakness, light-headedness, numbness and  headaches.  Hematological: Does not bruise/bleed easily.  Psychiatric/Behavioral: Negative for confusion, decreased concentration, dysphoric mood and sleep disturbance. The patient is not nervous/anxious.     Objective:  BP 129/82   Pulse 79   Temp 98 F (36.7 C) (Temporal)   Resp 20   Ht '5\' 5"'  (1.651 m)   Wt 152 lb 9.6 oz (69.2 kg)   SpO2 99%   BMI 25.39 kg/m   BP Readings from Last 3 Encounters:  09/17/19 129/82  08/27/18 139/90  10/10/16 128/83    Wt Readings from Last 3 Encounters:  09/17/19 152 lb 9.6 oz (69.2 kg)  08/27/18 146 lb (66.2 kg)  10/10/16 152 lb 3.2 oz (69 kg)     Physical Exam Constitutional:      Appearance: He is well-developed.  HENT:     Head: Normocephalic and atraumatic.  Eyes:     Pupils: Pupils are equal, round, and reactive to light.  Neck:     Musculoskeletal: Normal range of motion.     Thyroid: No thyromegaly.     Trachea: No tracheal deviation.  Cardiovascular:     Rate and Rhythm: Normal rate and regular rhythm.     Heart sounds: Normal heart sounds. No murmur. No friction rub. No gallop.   Pulmonary:     Breath sounds: Normal breath sounds. No wheezing or rales.  Abdominal:     General: Bowel sounds are normal. There is no distension.     Palpations: Abdomen is soft. There is no mass.     Tenderness: There is no abdominal tenderness.     Hernia: There is no hernia in the left inguinal area.  Genitourinary:    Penis: Normal.  Scrotum/Testes: Normal.  Musculoskeletal: Normal range of motion.  Lymphadenopathy:     Cervical: No cervical adenopathy.  Skin:    General: Skin is warm and dry.  Neurological:     Mental Status: He is alert and oriented to person, place, and time.       Assessment & Plan:   Tosh was seen today for annual exam.  Diagnoses and all orders for this visit:  Well adult exam -     CBC with Differential/Platelet -     CMP14+EGFR -     Lipid panel -     Urinalysis  Essential  hypertension -     CBC with Differential/Platelet -     CMP14+EGFR -     Lipid panel -     Urinalysis -     losartan (COZAAR) 50 MG tablet; Take 1 tablet (50 mg total) by mouth daily.  Screening for prostate cancer -     PSA Total (Reflex To Free)  Screen for colon cancer -     Cancel: Ambulatory referral to Gastroenterology  Lactose intolerance   Allergies as of 09/17/2019      Reactions   Lactose Intolerance (gi) Diarrhea   Sulfa Antibiotics Nausea Only      Medication List       Accurate as of September 17, 2019  3:10 PM. If you have any questions, ask your nurse or doctor.        STOP taking these medications   ciprofloxacin 500 MG tablet Commonly known as: Cipro Stopped by: Claretta Fraise, MD   imiquimod 5 % cream Commonly known as: Aldara Stopped by: Claretta Fraise, MD     TAKE these medications   losartan 50 MG tablet Commonly known as: COZAAR Take 1 tablet (50 mg total) by mouth daily.       Meds ordered this encounter  Medications  . losartan (COZAAR) 50 MG tablet    Sig: Take 1 tablet (50 mg total) by mouth daily.    Dispense:  90 tablet    Refill:  3     Follow-up: Return in about 1 year (around 09/16/2020).  Claretta Fraise, M.D.

## 2019-09-22 ENCOUNTER — Other Ambulatory Visit: Payer: Self-pay

## 2019-09-22 ENCOUNTER — Other Ambulatory Visit: Payer: PRIVATE HEALTH INSURANCE

## 2019-09-22 LAB — URINALYSIS
Bilirubin, UA: NEGATIVE
Glucose, UA: NEGATIVE
Ketones, UA: NEGATIVE
Leukocytes,UA: NEGATIVE
Nitrite, UA: NEGATIVE
Protein,UA: NEGATIVE
RBC, UA: NEGATIVE
Specific Gravity, UA: 1.025 (ref 1.005–1.030)
Urobilinogen, Ur: 0.2 mg/dL (ref 0.2–1.0)
pH, UA: 5 (ref 5.0–7.5)

## 2019-09-23 LAB — CMP14+EGFR
ALT: 18 IU/L (ref 0–44)
AST: 32 IU/L (ref 0–40)
Albumin/Globulin Ratio: 2.3 — ABNORMAL HIGH (ref 1.2–2.2)
Albumin: 4.6 g/dL (ref 4.0–5.0)
Alkaline Phosphatase: 58 IU/L (ref 39–117)
BUN/Creatinine Ratio: 9 (ref 9–20)
BUN: 8 mg/dL (ref 6–24)
Bilirubin Total: 0.4 mg/dL (ref 0.0–1.2)
CO2: 21 mmol/L (ref 20–29)
Calcium: 9.3 mg/dL (ref 8.7–10.2)
Chloride: 106 mmol/L (ref 96–106)
Creatinine, Ser: 0.86 mg/dL (ref 0.76–1.27)
GFR calc Af Amer: 118 mL/min/{1.73_m2} (ref >59)
GFR calc non Af Amer: 102 mL/min/{1.73_m2} (ref >59)
Globulin, Total: 2 g/dL (ref 1.5–4.5)
Glucose: 76 mg/dL (ref 65–99)
Potassium: 4.7 mmol/L (ref 3.5–5.2)
Sodium: 144 mmol/L (ref 134–144)
Total Protein: 6.6 g/dL (ref 6.0–8.5)

## 2019-09-23 LAB — CBC WITH DIFFERENTIAL/PLATELET
Basophils Absolute: 0.1 10*3/uL (ref 0.0–0.2)
Basos: 1 %
EOS (ABSOLUTE): 0.2 10*3/uL (ref 0.0–0.4)
Eos: 4 %
Hematocrit: 45.9 % (ref 37.5–51.0)
Hemoglobin: 14.9 g/dL (ref 13.0–17.7)
Immature Grans (Abs): 0 10*3/uL (ref 0.0–0.1)
Immature Granulocytes: 0 %
Lymphocytes Absolute: 2.1 10*3/uL (ref 0.7–3.1)
Lymphs: 40 %
MCH: 31 pg (ref 26.6–33.0)
MCHC: 32.5 g/dL (ref 31.5–35.7)
MCV: 96 fL (ref 79–97)
Monocytes Absolute: 0.5 10*3/uL (ref 0.1–0.9)
Monocytes: 9 %
Neutrophils Absolute: 2.5 10*3/uL (ref 1.4–7.0)
Neutrophils: 46 %
Platelets: 232 10*3/uL (ref 150–450)
RBC: 4.8 x10E6/uL (ref 4.14–5.80)
RDW: 12.3 % (ref 11.6–15.4)
WBC: 5.3 10*3/uL (ref 3.4–10.8)

## 2019-09-23 LAB — LIPID PANEL
Chol/HDL Ratio: 2.2 ratio (ref 0.0–5.0)
Cholesterol, Total: 162 mg/dL (ref 100–199)
HDL: 75 mg/dL (ref >39)
LDL Chol Calc (NIH): 63 mg/dL (ref 0–99)
Triglycerides: 140 mg/dL (ref 0–149)
VLDL Cholesterol Cal: 24 mg/dL (ref 5–40)

## 2019-09-23 LAB — PSA TOTAL (REFLEX TO FREE): Prostate Specific Ag, Serum: 0.3 ng/mL (ref 0.0–4.0)

## 2019-09-26 NOTE — Progress Notes (Signed)
Hello Hershy,  Your lab result is normal and/or stable.Some minor variations that are not significant are commonly marked abnormal, but do not represent any medical problem for you.  Best regards, Loriana Samad, M.D.

## 2020-01-25 ENCOUNTER — Ambulatory Visit (INDEPENDENT_AMBULATORY_CARE_PROVIDER_SITE_OTHER): Payer: PRIVATE HEALTH INSURANCE | Admitting: Family Medicine

## 2020-01-25 ENCOUNTER — Encounter: Payer: Self-pay | Admitting: Family Medicine

## 2020-01-25 DIAGNOSIS — J0100 Acute maxillary sinusitis, unspecified: Secondary | ICD-10-CM

## 2020-01-25 MED ORDER — AMOXICILLIN-POT CLAVULANATE 875-125 MG PO TABS: 1.0000 | ORAL_TABLET | Freq: Two times a day (BID) | ORAL | 0 refills | Status: DC

## 2020-01-25 NOTE — Progress Notes (Signed)
    Subjective:    Patient ID: Roberto Kennedy, male    DOB: [birth year], 49 y.o.   MRN: [id number]   HPI: Roberto Kennedy is a 49 y.o. male presenting for Symptoms include congestion, facial pain, nasal congestion, non productive cough, post nasal drip, upper teeth hurt and sinus pressure. There is  Fever low grade to 99.4. Onset of symptoms was two days ago, gradually worsening since that time.     Depression screen [facility] 2/9 09/17/2019 08/27/2018 10/10/2016 04/12/2016 05/05/2014  Decreased Interest 0 0 0 0 0  Down, Depressed, Hopeless 0 0 0 0 0  PHQ - 2 Score 0 0 0 0 0     Relevant past medical, surgical, family and social history reviewed and updated as indicated.  Interim medical history since our last visit reviewed. Allergies and medications reviewed and updated.  ROS:  Review of Systems  Constitutional: Negative for activity change, appetite change, chills and fever.  HENT: Positive for congestion, postnasal drip, rhinorrhea and sinus pressure. Negative for ear discharge, ear pain, hearing loss, nosebleeds, sneezing and trouble swallowing.   Respiratory: Negative for chest tightness and shortness of breath.   Cardiovascular: Negative for chest pain and palpitations.  Skin: Negative for rash.     Social History   Tobacco Use  Smoking Status Never Smoker  Smokeless Tobacco Never Used       Objective:     Wt Readings from Last 3 Encounters:  09/17/19 152 lb 9.6 oz (69.2 kg)  08/27/18 146 lb (66.2 kg)  10/10/16 152 lb 3.2 oz (69 kg)     Exam deferred. Pt. Harboring due to COVID 19. Phone visit performed.   Assessment & Plan:   1. Acute maxillary sinusitis, recurrence not specified     Meds ordered this encounter  Medications  . amoxicillin-clavulanate (AUGMENTIN) 875-125 MG tablet    Sig: Take 1 tablet by mouth 2 (two) times daily. Take all of this medication    Dispense:  20 tablet    Refill:  0    No orders of the defined types were placed in this encounter.     Diagnoses and all orders for this visit:  Acute maxillary sinusitis, recurrence not specified -     amoxicillin-clavulanate (AUGMENTIN) 875-125 MG tablet; Take 1 tablet by mouth 2 (two) times daily. Take all of this medication    Virtual Visit via telephone Note  I discussed the limitations, risks, security and privacy concerns of performing an evaluation and management service by telephone and the availability of in person appointments. The patient was identified with two identifiers. Pt.expressed understanding and agreed to proceed. Pt. Is at home. Dr. Livia Snellen is in his office.  Follow Up Instructions:   I discussed the assessment and treatment plan with the patient. The patient was provided an opportunity to ask questions and all were answered. The patient agreed with the plan and demonstrated an understanding of the instructions.   The patient was advised to call back or seek an in-person evaluation if the symptoms worsen or if the condition fails to improve as anticipated.   Total minutes including chart review and phone contact time: 11   Follow up plan: Return if symptoms worsen or fail to improve.  Claretta Fraise, MD [place]

## 2020-11-10 ENCOUNTER — Other Ambulatory Visit: Payer: Self-pay | Admitting: Family Medicine

## 2020-11-10 DIAGNOSIS — I10 Essential (primary) hypertension: Secondary | ICD-10-CM

## 2020-11-15 ENCOUNTER — Telehealth: Payer: Self-pay

## 2020-11-15 DIAGNOSIS — I10 Essential (primary) hypertension: Secondary | ICD-10-CM

## 2020-11-15 MED ORDER — LOSARTAN POTASSIUM 50 MG PO TABS: 50.0000 mg | ORAL_TABLET | Freq: Every day | ORAL | 0 refills | Status: DC

## 2020-11-15 NOTE — Telephone Encounter (Signed)
  Prescription Request  11/15/2020  What is the name of the medication or equipment? Losartan  Have you contacted your pharmacy to request a refill? (if applicable) Yes  Which pharmacy would you like this sent to? [facility]  Pt scheduled appt to see Dr Livia Snellen on 01/02/21 for CPE/Med Refill. Is needing refill on his BP medicine to last him until his appt.   Patient notified that their request is being sent to the clinical staff for review and that they should receive a response within 2 business days.

## 2020-11-15 NOTE — Telephone Encounter (Signed)
90 days supply sent to pharmacy.  Patient aware he will need to be seen for any further refills

## 2020-12-13 ENCOUNTER — Telehealth: Payer: Self-pay

## 2020-12-13 ENCOUNTER — Other Ambulatory Visit: Payer: Self-pay

## 2020-12-13 DIAGNOSIS — Z125 Encounter for screening for malignant neoplasm of prostate: Secondary | ICD-10-CM

## 2020-12-13 DIAGNOSIS — I10 Essential (primary) hypertension: Secondary | ICD-10-CM

## 2020-12-13 NOTE — Telephone Encounter (Signed)
Pt has an appt scheduled for 01/02/21 with [facility]. Orders placed for UA, CMP, Lipid, PSA and CBC. Pt informed.

## 2020-12-14 ENCOUNTER — Other Ambulatory Visit: Payer: Self-pay

## 2020-12-14 ENCOUNTER — Other Ambulatory Visit: Payer: PRIVATE HEALTH INSURANCE

## 2020-12-14 DIAGNOSIS — I10 Essential (primary) hypertension: Secondary | ICD-10-CM

## 2020-12-14 DIAGNOSIS — Z125 Encounter for screening for malignant neoplasm of prostate: Secondary | ICD-10-CM

## 2020-12-15 LAB — CBC WITH DIFFERENTIAL/PLATELET
Basophils Absolute: 0.1 10*3/uL (ref 0.0–0.2)
Basos: 1 %
EOS (ABSOLUTE): 0.2 10*3/uL (ref 0.0–0.4)
Eos: 3 %
Hematocrit: 46.9 % (ref 37.5–51.0)
Hemoglobin: 15.9 g/dL (ref 13.0–17.7)
Immature Grans (Abs): 0 10*3/uL (ref 0.0–0.1)
Immature Granulocytes: 0 %
Lymphocytes Absolute: 1.9 10*3/uL (ref 0.7–3.1)
Lymphs: 37 %
MCH: 31.7 pg (ref 26.6–33.0)
MCHC: 33.9 g/dL (ref 31.5–35.7)
MCV: 93 fL (ref 79–97)
Monocytes Absolute: 0.5 10*3/uL (ref 0.1–0.9)
Monocytes: 10 %
Neutrophils Absolute: 2.5 10*3/uL (ref 1.4–7.0)
Neutrophils: 49 %
Platelets: 240 10*3/uL (ref 150–450)
RBC: 5.02 x10E6/uL (ref 4.14–5.80)
RDW: 12.4 % (ref 11.6–15.4)
WBC: 5.2 10*3/uL (ref 3.4–10.8)

## 2020-12-15 LAB — CMP14+EGFR
ALT: 17 IU/L (ref 0–44)
AST: 31 IU/L (ref 0–40)
Albumin/Globulin Ratio: 1.7 (ref 1.2–2.2)
Albumin: 4.5 g/dL (ref 4.0–5.0)
Alkaline Phosphatase: 65 IU/L (ref 44–121)
BUN/Creatinine Ratio: 12 (ref 9–20)
BUN: 10 mg/dL (ref 6–24)
Bilirubin Total: 0.6 mg/dL (ref 0.0–1.2)
CO2: 23 mmol/L (ref 20–29)
Calcium: 9.3 mg/dL (ref 8.7–10.2)
Chloride: 101 mmol/L (ref 96–106)
Creatinine, Ser: 0.83 mg/dL (ref 0.76–1.27)
GFR calc Af Amer: 119 mL/min/{1.73_m2} (ref >59)
GFR calc non Af Amer: 103 mL/min/{1.73_m2} (ref >59)
Globulin, Total: 2.6 g/dL (ref 1.5–4.5)
Glucose: 86 mg/dL (ref 65–99)
Potassium: 4.8 mmol/L (ref 3.5–5.2)
Sodium: 140 mmol/L (ref 134–144)
Total Protein: 7.1 g/dL (ref 6.0–8.5)

## 2020-12-15 LAB — PSA, TOTAL AND FREE
PSA, Free Pct: 32.5 %
PSA, Free: 0.13 ng/mL
Prostate Specific Ag, Serum: 0.4 ng/mL (ref 0.0–4.0)

## 2020-12-15 LAB — LIPID PANEL
Chol/HDL Ratio: 1.9 ratio (ref 0.0–5.0)
Cholesterol, Total: 184 mg/dL (ref 100–199)
HDL: 95 mg/dL (ref >39)
LDL Chol Calc (NIH): 78 mg/dL (ref 0–99)
Triglycerides: 58 mg/dL (ref 0–149)
VLDL Cholesterol Cal: 11 mg/dL (ref 5–40)

## 2021-01-02 ENCOUNTER — Other Ambulatory Visit: Payer: Self-pay

## 2021-01-02 ENCOUNTER — Encounter: Payer: Self-pay | Admitting: Family Medicine

## 2021-01-02 ENCOUNTER — Ambulatory Visit (INDEPENDENT_AMBULATORY_CARE_PROVIDER_SITE_OTHER): Payer: PRIVATE HEALTH INSURANCE | Admitting: Family Medicine

## 2021-01-02 VITALS — BP 138/85 | HR 71 | Temp 98.0°F | Ht 65.0 in | Wt 151.0 lb

## 2021-01-02 DIAGNOSIS — G44311 Acute post-traumatic headache, intractable: Secondary | ICD-10-CM

## 2021-01-02 DIAGNOSIS — Z0000 Encounter for general adult medical examination without abnormal findings: Secondary | ICD-10-CM

## 2021-01-02 DIAGNOSIS — S022XXA Fracture of nasal bones, initial encounter for closed fracture: Secondary | ICD-10-CM

## 2021-01-02 DIAGNOSIS — Z1211 Encounter for screening for malignant neoplasm of colon: Secondary | ICD-10-CM

## 2021-01-02 DIAGNOSIS — I10 Essential (primary) hypertension: Secondary | ICD-10-CM | POA: Diagnosis not present

## 2021-01-02 DIAGNOSIS — J309 Allergic rhinitis, unspecified: Secondary | ICD-10-CM | POA: Diagnosis not present

## 2021-01-02 DIAGNOSIS — Z0001 Encounter for general adult medical examination with abnormal findings: Secondary | ICD-10-CM | POA: Diagnosis not present

## 2021-01-02 DIAGNOSIS — E739 Lactose intolerance, unspecified: Secondary | ICD-10-CM

## 2021-01-02 MED ORDER — LOSARTAN POTASSIUM 50 MG PO TABS: 50.0000 mg | ORAL_TABLET | Freq: Every day | ORAL | 3 refills | Status: DC
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
## 2021-01-11 ENCOUNTER — Telehealth: Payer: Self-pay

## 2021-01-11 NOTE — Telephone Encounter (Signed)
Pt aware of provider feedback and voiced understanding. 
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
## 2021-02-01 ENCOUNTER — Ambulatory Visit (INDEPENDENT_AMBULATORY_CARE_PROVIDER_SITE_OTHER): Payer: PRIVATE HEALTH INSURANCE | Admitting: Family Medicine

## 2021-02-01 ENCOUNTER — Encounter: Payer: Self-pay | Admitting: Family Medicine

## 2021-02-01 DIAGNOSIS — U071 COVID-19: Secondary | ICD-10-CM

## 2021-02-01 DIAGNOSIS — J0100 Acute maxillary sinusitis, unspecified: Secondary | ICD-10-CM

## 2021-02-01 MED ORDER — AMOXICILLIN-POT CLAVULANATE 875-125 MG PO TABS: 1.0000 | ORAL_TABLET | Freq: Two times a day (BID) | ORAL | 0 refills | Status: DC

## 2021-02-01 NOTE — Progress Notes (Signed)
Subjective:    Patient ID: Roberto Kennedy, male    DOB: [birth year], 50 y.o.   MRN: [id number]   HPI: Roberto Kennedy is a 50 y.o. male presenting for Symptoms include congestion, facial pain, nasal congestion, non productive cough, post nasal drip and sinus pressure. There is 101-102 degree fever with chills, or sweats. Onset of symptoms was a few days ago, gradually worsening since that time. Rapid CoVID test X 2 at home 1st neg, 2nd positive. Has a PCR test pending from [facility]. Just coleected specimen an hour ago. .     Depression screen [facility] 2/9 01/02/2021 09/17/2019 08/27/2018 10/10/2016 04/12/2016  Decreased Interest 0 0 0 0 0  Down, Depressed, Hopeless 0 0 0 0 0  PHQ - 2 Score 0 0 0 0 0     Relevant past medical, surgical, family and social history reviewed and updated as indicated.  Interim medical history since our last visit reviewed. Allergies and medications reviewed and updated.  ROS:  Review of Systems  Constitutional: Positive for fever. Negative for activity change (working from home).  HENT: Positive for congestion, postnasal drip, rhinorrhea and sinus pressure.      Social History   Tobacco Use  Smoking Status Never Smoker  Smokeless Tobacco Never Used       Objective:     Wt Readings from Last 3 Encounters:  01/02/21 151 lb (68.5 kg)  09/17/19 152 lb 9.6 oz (69.2 kg)  08/27/18 146 lb (66.2 kg)     Exam deferred. Pt. Harboring due to COVID 19. Phone visit performed.   Assessment & Plan:   1. COVID   2. Acute maxillary sinusitis, recurrence not specified     Meds ordered this encounter  Medications  . amoxicillin-clavulanate (AUGMENTIN) 875-125 MG tablet    Sig: Take 1 tablet by mouth 2 (two) times daily. Take all of this medication    Dispense:  20 tablet    Refill:  0    Mr. Suder is aware that he needs to be symptom-free for 5 days before he can return to work.  This is assuming that the PCR test is positive.  If the PCR test is negative, the home  tests that he did are considered false results.  He will let me know the results of the PCR once they are available.    Diagnoses and all orders for this visit:  COVID  Acute maxillary sinusitis, recurrence not specified  Other orders -     amoxicillin-clavulanate (AUGMENTIN) 875-125 MG tablet; Take 1 tablet by mouth 2 (two) times daily. Take all of this medication    Virtual Visit via telephone Note  I discussed the limitations, risks, security and privacy concerns of performing an evaluation and management service by telephone and the availability of in person appointments. The patient was identified with two identifiers. Pt.expressed understanding and agreed to proceed. Pt. Is at home. Dr. Livia Snellen is in his office.  Follow Up Instructions:   I discussed the assessment and treatment plan with the patient. The patient was provided an opportunity to ask questions and all were answered. The patient agreed with the plan and demonstrated an understanding of the instructions.   The patient was advised to call back or seek an in-person evaluation if the symptoms worsen or if the condition fails to improve as anticipated.   Total minutes including chart review and phone contact time: 14   Follow up plan: Return if symptoms worsen or fail to improve.  Claretta Fraise, MD [place]

## 2021-02-02 ENCOUNTER — Other Ambulatory Visit: Payer: Self-pay | Admitting: Family Medicine

## 2021-02-02 ENCOUNTER — Telehealth: Payer: Self-pay

## 2021-02-02 MED ORDER — AZITHROMYCIN 250 MG PO TABS: ORAL_TABLET | ORAL | 0 refills | Status: DC

## 2021-02-02 MED ORDER — DEXAMETHASONE 6 MG PO TABS: 6.0000 mg | ORAL_TABLET | Freq: Two times a day (BID) | ORAL | 0 refills | Status: DC

## 2021-02-02 NOTE — Telephone Encounter (Signed)
Left message to call back  

## 2021-02-02 NOTE — Telephone Encounter (Signed)
I sent in dexamethasone and a Z-Pak for him

## 2021-04-21 ENCOUNTER — Other Ambulatory Visit: Payer: Self-pay

## 2021-04-21 ENCOUNTER — Ambulatory Visit (INDEPENDENT_AMBULATORY_CARE_PROVIDER_SITE_OTHER): Payer: PRIVATE HEALTH INSURANCE | Admitting: Family Medicine

## 2021-04-21 ENCOUNTER — Encounter: Payer: Self-pay | Admitting: Family Medicine

## 2021-04-21 VITALS — BP 138/88 | HR 80 | Temp 97.9°F | Resp 20 | Ht 65.0 in | Wt 149.0 lb

## 2021-04-21 DIAGNOSIS — I10 Essential (primary) hypertension: Secondary | ICD-10-CM

## 2021-04-21 MED ORDER — VALSARTAN-HYDROCHLOROTHIAZIDE 320-25 MG PO TABS: 1.0000 | ORAL_TABLET | Freq: Every day | ORAL | 2 refills | Status: DC

## 2021-04-21 NOTE — Progress Notes (Signed)
Subjective:  Patient ID: Roberto Kennedy, male    DOB: 1971-05-17  Age: 50 y.o. MRN: [id number]  CC: BP issues (High 170/110 after a meeting recently )   HPI Roberto Kennedy presents for  follow-up of hypertension. Patient has no history of chest pain or shortness of breath or recent cough. Patient also denies symptoms of TIA such as focal numbness or weakness. Patient denies side effects from medication. States taking it regularly. Vague pressure HA for several days. Worried that father had rupture of brain aneurysm   History Roberto Kennedy has a past medical history of Allergy and Hypertension.   He has a past surgical history that includes Colonoscopy (05/09/2011).   His family history includes Colon cancer (age of onset: 55) in his father.He reports that he has never smoked. He has never used smokeless tobacco. He reports current alcohol use of about 1.0 standard drink of alcohol per week. He reports that he does not use drugs.  No current outpatient medications on file prior to visit.   No current facility-administered medications on file prior to visit.    ROS Review of Systems  Constitutional: Negative for fever.  Respiratory: Negative for shortness of breath.   Cardiovascular: Negative for chest pain.  Musculoskeletal: Negative for arthralgias.  Skin: Negative for rash.  Neurological: Positive for headaches.    Objective:  BP 138/88   Pulse 80   Temp 97.9 F (36.6 C)   Resp 20   Ht _0  (1.651 m)   Wt 149 lb (67.6 kg)   SpO2 100%   BMI 24.79 kg/m   BP Readings from Last 3 Encounters:  04/21/21 138/88  01/02/21 138/85  09/17/19 129/82    Wt Readings from Last 3 Encounters:  04/21/21 149 lb (67.6 kg)  01/02/21 151 lb (68.5 kg)  09/17/19 152 lb 9.6 oz (69.2 kg)     Physical Exam Vitals reviewed.  Constitutional:      Appearance: He is well-developed.  HENT:     Head: Normocephalic and atraumatic.     Right Ear: Tympanic membrane and external ear normal. No  decreased hearing noted.     Left Ear: Tympanic membrane and external ear normal. No decreased hearing noted.     Mouth/Throat:     Pharynx: No oropharyngeal exudate or posterior oropharyngeal erythema.  Eyes:     Pupils: Pupils are equal, round, and reactive to light.  Cardiovascular:     Rate and Rhythm: Normal rate and regular rhythm.     Heart sounds: No murmur heard.   Pulmonary:     Effort: No respiratory distress.     Breath sounds: Normal breath sounds.  Abdominal:     General: Bowel sounds are normal.     Palpations: Abdomen is soft. There is no mass.     Tenderness: There is no abdominal tenderness.  Musculoskeletal:     Cervical back: Normal range of motion and neck supple.       Assessment & Plan:   Roberto Kennedy was seen today for bp issues.  Diagnoses and all orders for this visit:  Primary hypertension -     CBC with Differential/Platelet -     CMP14+EGFR -     TSH  Other orders -     valsartan-hydrochlorothiazide (DIOVAN HCT) 320-25 MG tablet; Take 1 tablet by mouth daily. For Blood Pressure   Allergies as of 04/21/2021      Reactions   Lactose Intolerance (gi) Diarrhea   Sulfa Antibiotics Nausea Only  Medication List       Accurate as of April 21, 2021 10:10 AM. If you have any questions, ask your nurse or doctor.        STOP taking these medications   amoxicillin-clavulanate 875-125 MG tablet Commonly known as: AUGMENTIN Stopped by: Claretta Fraise, MD   azithromycin 250 MG tablet Commonly known as: Zithromax Z-Pak Stopped by: Claretta Fraise, MD   dexamethasone 6 MG tablet Commonly known as: DECADRON Stopped by: Claretta Fraise, MD   losartan 50 MG tablet Commonly known as: COZAAR Stopped by: Claretta Fraise, MD     TAKE these medications   valsartan-hydrochlorothiazide 320-25 MG tablet Commonly known as: Diovan HCT Take 1 tablet by mouth daily. For Blood Pressure Started by: Claretta Fraise, MD       Meds ordered this encounter   Medications  . valsartan-hydrochlorothiazide (DIOVAN HCT) 320-25 MG tablet    Sig: Take 1 tablet by mouth daily. For Blood Pressure    Dispense:  30 tablet    Refill:  2    Follow up sooner if HA worsens, or fails to improve as pressure comes down.  Follow-up: Return in about 1 month (around 05/21/2021) for hypertension.  Claretta Fraise, M.D.

## 2021-04-22 LAB — CMP14+EGFR
ALT: 14 IU/L (ref 0–44)
AST: 25 IU/L (ref 0–40)
Albumin/Globulin Ratio: 2.6 — ABNORMAL HIGH (ref 1.2–2.2)
Albumin: 5.2 g/dL — ABNORMAL HIGH (ref 4.0–5.0)
Alkaline Phosphatase: 66 IU/L (ref 44–121)
BUN/Creatinine Ratio: 13 (ref 9–20)
BUN: 12 mg/dL (ref 6–24)
Bilirubin Total: 0.6 mg/dL (ref 0.0–1.2)
CO2: 23 mmol/L (ref 20–29)
Calcium: 10.1 mg/dL (ref 8.7–10.2)
Chloride: 102 mmol/L (ref 96–106)
Creatinine, Ser: 0.92 mg/dL (ref 0.76–1.27)
Globulin, Total: 2 g/dL (ref 1.5–4.5)
Glucose: 107 mg/dL — ABNORMAL HIGH (ref 65–99)
Potassium: 4.7 mmol/L (ref 3.5–5.2)
Sodium: 141 mmol/L (ref 134–144)
Total Protein: 7.2 g/dL (ref 6.0–8.5)
eGFR: 101 mL/min/{1.73_m2} (ref >59)

## 2021-04-22 LAB — CBC WITH DIFFERENTIAL/PLATELET
Basophils Absolute: 0 10*3/uL (ref 0.0–0.2)
Basos: 1 %
EOS (ABSOLUTE): 0.1 10*3/uL (ref 0.0–0.4)
Eos: 2 %
Hematocrit: 47.6 % (ref 37.5–51.0)
Hemoglobin: 16.2 g/dL (ref 13.0–17.7)
Immature Grans (Abs): 0 10*3/uL (ref 0.0–0.1)
Immature Granulocytes: 0 %
Lymphocytes Absolute: 1.6 10*3/uL (ref 0.7–3.1)
Lymphs: 21 %
MCH: 32 pg (ref 26.6–33.0)
MCHC: 34 g/dL (ref 31.5–35.7)
MCV: 94 fL (ref 79–97)
Monocytes Absolute: 0.9 10*3/uL (ref 0.1–0.9)
Monocytes: 13 %
Neutrophils Absolute: 4.7 10*3/uL (ref 1.4–7.0)
Neutrophils: 63 %
Platelets: 235 10*3/uL (ref 150–450)
RBC: 5.06 x10E6/uL (ref 4.14–5.80)
RDW: 12.6 % (ref 11.6–15.4)
WBC: 7.4 10*3/uL (ref 3.4–10.8)

## 2021-04-22 LAB — TSH: TSH: 1.89 u[IU]/mL (ref 0.450–4.500)

## 2021-04-28 NOTE — Progress Notes (Signed)
Hello Roberto Kennedy,  Your lab result is normal and/or stable.Some minor variations that are not significant are commonly marked abnormal, but do not represent any medical problem for you.  Best regards, Deaven Urwin, M.D.

## 2021-05-17 ENCOUNTER — Other Ambulatory Visit: Payer: Self-pay

## 2021-05-17 ENCOUNTER — Ambulatory Visit (INDEPENDENT_AMBULATORY_CARE_PROVIDER_SITE_OTHER): Payer: PRIVATE HEALTH INSURANCE | Admitting: Family Medicine

## 2021-05-17 ENCOUNTER — Encounter: Payer: Self-pay | Admitting: Family Medicine

## 2021-05-17 VITALS — BP 125/75 | HR 99 | Temp 97.8°F | Ht 65.0 in | Wt 152.8 lb

## 2021-05-17 DIAGNOSIS — Z114 Encounter for screening for human immunodeficiency virus [HIV]: Secondary | ICD-10-CM | POA: Diagnosis not present

## 2021-05-17 DIAGNOSIS — Z1159 Encounter for screening for other viral diseases: Secondary | ICD-10-CM

## 2021-05-17 DIAGNOSIS — I10 Essential (primary) hypertension: Secondary | ICD-10-CM | POA: Diagnosis not present

## 2021-05-17 NOTE — Progress Notes (Signed)
Subjective:  Patient ID: Roberto Kennedy, male    DOB: 1971/07/24  Age: 50 y.o. MRN: [id number]  CC: Hypertension   HPI [name] presents for  follow-up of hypertension. Patient has no history of headache chest pain or shortness of breath or recent cough. Patient also denies symptoms of TIA such as focal numbness or weakness. Patient denies side effects from medication. States taking it regularly.   History Roberto Kennedy has a past medical history of Allergy and Hypertension.   He has a past surgical history that includes Colonoscopy (05/09/2011).   His family history includes Colon cancer (age of onset: 28) in his father.He reports that he has never smoked. He has never used smokeless tobacco. He reports current alcohol use of about 1.0 standard drink of alcohol per week. He reports that he does not use drugs.  Current Outpatient Medications on File Prior to Visit  Medication Sig Dispense Refill  . valsartan-hydrochlorothiazide (DIOVAN HCT) 320-25 MG tablet Take 1 tablet by mouth daily. For Blood Pressure 30 tablet 2   No current facility-administered medications on file prior to visit.    ROS Review of Systems  Constitutional: Negative for fever.  Respiratory: Negative for shortness of breath.   Cardiovascular: Negative for chest pain.  Musculoskeletal: Negative for arthralgias.  Skin: Negative for rash.    Objective:  BP 125/75   Pulse 99   Temp 97.8 F (36.6 C)   Ht '5\' 5"'  (1.651 m)   Wt 152 lb 12.8 oz (69.3 kg)   SpO2 99%   BMI 25.43 kg/m   BP Readings from Last 3 Encounters:  05/17/21 125/75  04/21/21 138/88  01/02/21 138/85    Wt Readings from Last 3 Encounters:  05/17/21 152 lb 12.8 oz (69.3 kg)  04/21/21 149 lb (67.6 kg)  01/02/21 151 lb (68.5 kg)     Physical Exam Vitals reviewed.  Constitutional:      Appearance: He is well-developed.  HENT:     Head: Normocephalic and atraumatic.     Right Ear: External ear normal.     Left Ear: External ear  normal.     Mouth/Throat:     Pharynx: No oropharyngeal exudate or posterior oropharyngeal erythema.  Eyes:     Pupils: Pupils are equal, round, and reactive to light.  Cardiovascular:     Rate and Rhythm: Normal rate and regular rhythm.     Heart sounds: No murmur heard.   Pulmonary:     Effort: No respiratory distress.     Breath sounds: Normal breath sounds.  Musculoskeletal:     Cervical back: Normal range of motion and neck supple.  Neurological:     Mental Status: He is alert and oriented to person, place, and time.       Assessment & Plan:   Laymond was seen today for hypertension.  Diagnoses and all orders for this visit:  Primary hypertension -     Hepatitis C antibody -     HIV Antibody (routine testing w rflx) -     BMP8+EGFR  Need for hepatitis C screening test -     Hepatitis C antibody  Encounter for screening for HIV -     HIV Antibody (routine testing w rflx)   Allergies as of 05/17/2021      Reactions   Lactose Intolerance (gi) Diarrhea   Sulfa Antibiotics Nausea Only      Medication List       Accurate as of May 17, 2021 11:59 PM.  If you have any questions, ask your nurse or doctor.        valsartan-hydrochlorothiazide 320-25 MG tablet Commonly known as: Diovan HCT Take 1 tablet by mouth daily. For Blood Pressure       No orders of the defined types were placed in this encounter.     Follow-up: No follow-ups on file.  Claretta Fraise, M.D.

## 2021-05-18 LAB — BMP8+EGFR
BUN/Creatinine Ratio: 17 (ref 9–20)
BUN: 17 mg/dL (ref 6–24)
CO2: 28 mmol/L (ref 20–29)
Calcium: 9.9 mg/dL (ref 8.7–10.2)
Chloride: 97 mmol/L (ref 96–106)
Creatinine, Ser: 0.98 mg/dL (ref 0.76–1.27)
Glucose: 108 mg/dL — ABNORMAL HIGH (ref 65–99)
Potassium: 4.7 mmol/L (ref 3.5–5.2)
Sodium: 139 mmol/L (ref 134–144)
eGFR: 94 mL/min/{1.73_m2} (ref >59)

## 2021-05-18 LAB — HIV ANTIBODY (ROUTINE TESTING W REFLEX): HIV Screen 4th Generation wRfx: NONREACTIVE

## 2021-05-18 LAB — HEPATITIS C ANTIBODY: Hep C Virus Ab: <0.1 s/co ratio (ref 0.0–0.9)

## 2021-05-18 NOTE — Progress Notes (Signed)
Hello Roberto Kennedy,  Your lab result is normal and/or stable.Some minor variations that are not significant are commonly marked abnormal, but do not represent any medical problem for you.  Best regards, Maricella Filyaw, M.D.

## 2021-05-22 ENCOUNTER — Encounter: Payer: Self-pay | Admitting: Family Medicine

## 2021-05-22 MED ORDER — VALSARTAN-HYDROCHLOROTHIAZIDE 320-25 MG PO TABS: 1.0000 | ORAL_TABLET | Freq: Every day | ORAL | 3 refills | Status: DC

## 2022-06-10 ENCOUNTER — Other Ambulatory Visit: Payer: Self-pay | Admitting: Family Medicine

## 2022-09-03 ENCOUNTER — Encounter: Payer: Self-pay | Admitting: Family Medicine

## 2022-09-03 ENCOUNTER — Ambulatory Visit (INDEPENDENT_AMBULATORY_CARE_PROVIDER_SITE_OTHER): Payer: BC Managed Care – PPO | Admitting: Family Medicine

## 2022-09-03 ENCOUNTER — Other Ambulatory Visit: Payer: Self-pay | Admitting: Family Medicine

## 2022-09-03 VITALS — BP 119/81 | HR 81 | Temp 97.9°F | Ht 65.0 in | Wt 154.0 lb

## 2022-09-03 DIAGNOSIS — Z23 Encounter for immunization: Secondary | ICD-10-CM | POA: Diagnosis not present

## 2022-09-03 DIAGNOSIS — Z0001 Encounter for general adult medical examination with abnormal findings: Secondary | ICD-10-CM | POA: Diagnosis not present

## 2022-09-03 DIAGNOSIS — Z0000 Encounter for general adult medical examination without abnormal findings: Secondary | ICD-10-CM

## 2022-09-03 DIAGNOSIS — I10 Essential (primary) hypertension: Secondary | ICD-10-CM

## 2022-09-03 DIAGNOSIS — E559 Vitamin D deficiency, unspecified: Secondary | ICD-10-CM | POA: Diagnosis not present

## 2022-09-03 LAB — URINALYSIS
Bilirubin, UA: NEGATIVE
Glucose, UA: NEGATIVE
Leukocytes,UA: NEGATIVE
Nitrite, UA: NEGATIVE
Protein,UA: NEGATIVE
RBC, UA: NEGATIVE
Specific Gravity, UA: 1.015 (ref 1.005–1.030)
Urobilinogen, Ur: 0.2 mg/dL (ref 0.2–1.0)
pH, UA: 5 (ref 5.0–7.5)

## 2022-09-03 MED ORDER — VALSARTAN-HYDROCHLOROTHIAZIDE 320-25 MG PO TABS: 1.0000 | ORAL_TABLET | Freq: Every day | ORAL | 3 refills | Status: DC

## 2022-09-03 NOTE — Telephone Encounter (Signed)
Refill denied. Pt hasn't been seen since 04/2021. Please schedule appt with PCP for further refills.

## 2022-09-03 NOTE — Progress Notes (Signed)
Subjective:  Patient ID: Roberto Kennedy, male    DOB: 1971-01-16  Age: 51 y.o. MRN: [id number]  CC: Annual Exam   HPI Bud Kaeser presents for CPE. 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      05/17/2021    1:01 PM 04/21/2021    9:16 AM 01/02/2021   11:05 AM  Depression screen PHQ 2/9  Decreased Interest 0 0 0  Down, Depressed, Hopeless 0 0 0  PHQ - 2 Score 0 0 0    History Graeson has a past medical history of Allergy and Hypertension.   He has a past surgical history that includes Colonoscopy (05/09/2011).   His family history includes Colon cancer (age of onset: 55) in his father.He reports that he has never smoked. He has never used smokeless tobacco. He reports current alcohol use of about 1.0 standard drink of alcohol per week. He reports that he does not use drugs.    ROS Review of Systems  Constitutional:  Negative for activity change, fatigue and unexpected weight change.  HENT:  Negative for congestion, ear pain, hearing loss, postnasal drip and trouble swallowing.   Eyes:  Negative for pain and visual disturbance.  Respiratory:  Negative for cough, chest tightness and shortness of breath.   Cardiovascular:  Negative for chest pain, palpitations and leg swelling.  Gastrointestinal:  Negative for abdominal distention, abdominal pain, blood in stool, constipation, diarrhea, nausea and vomiting.  Endocrine: Negative for cold intolerance, heat intolerance and polydipsia.  Genitourinary:  Negative for difficulty urinating, dysuria, flank pain, frequency and urgency.  Musculoskeletal:  Negative for arthralgias and joint swelling.  Skin:  Negative for color change, rash and wound.  Neurological:  Negative for dizziness, syncope, speech difficulty, weakness, light-headedness, numbness and headaches.  Hematological:  Does not bruise/bleed easily.  Psychiatric/Behavioral:  Negative for confusion, decreased concentration, dysphoric mood and sleep disturbance. The patient is not  nervous/anxious.     Objective:  BP 119/81   Pulse 81   Temp 97.9 F (36.6 C)   Ht '5\' 5"'  (1.651 m)   Wt 154 lb (69.9 kg)   SpO2 99%   BMI 25.63 kg/m   BP Readings from Last 3 Encounters:  09/03/22 119/81  05/17/21 125/75  04/21/21 138/88    Wt Readings from Last 3 Encounters:  09/03/22 154 lb (69.9 kg)  05/17/21 152 lb 12.8 oz (69.3 kg)  04/21/21 149 lb (67.6 kg)     Physical Exam Constitutional:      Appearance: He is well-developed.  HENT:     Head: Normocephalic and atraumatic.  Eyes:     Pupils: Pupils are equal, round, and reactive to light.  Neck:     Thyroid: No thyromegaly.     Trachea: No tracheal deviation.  Cardiovascular:     Rate and Rhythm: Normal rate and regular rhythm.     Heart sounds: Normal heart sounds. No murmur heard.    No friction rub. No gallop.  Pulmonary:     Breath sounds: Normal breath sounds. No wheezing or rales.  Abdominal:     General: Bowel sounds are normal. There is no distension.     Palpations: Abdomen is soft. There is no mass.     Tenderness: There is no abdominal tenderness.     Hernia: There is no hernia in the left inguinal area.  Genitourinary:    Penis: Normal.      Testes: Normal.  Musculoskeletal:        General: Normal range of  motion.     Cervical back: Normal range of motion.  Lymphadenopathy:     Cervical: No cervical adenopathy.  Skin:    General: Skin is warm and dry.  Neurological:     Mental Status: He is alert and oriented to person, place, and time.       Assessment & Plan:   Vann was seen today for annual exam.  Diagnoses and all orders for this visit:  Well adult exam -     CBC with Differential/Platelet -     CMP14+EGFR -     Lipid panel -     PSA, total and free -     Urinalysis -     VITAMIN D 25 Hydroxy (Vit-D Deficiency, Fractures)  Need for shingles vaccine -     Zoster Recombinant (Shingrix )  Primary hypertension -     CBC with Differential/Platelet -      CMP14+EGFR -     Lipid panel -     PSA, total and free -     Urinalysis -     VITAMIN D 25 Hydroxy (Vit-D Deficiency, Fractures)  Vitamin D deficiency -     VITAMIN D 25 Hydroxy (Vit-D Deficiency, Fractures)  Other orders -     valsartan-hydrochlorothiazide (DIOVAN-HCT) 320-25 MG tablet; Take 1 tablet by mouth daily. For Blood Pressure       I have changed Tripp Bowron's valsartan-hydrochlorothiazide.  Allergies as of 09/03/2022       Reactions   Lactose Intolerance (gi) Diarrhea   Sulfa Antibiotics Nausea Only        Medication List        Accurate as of September 03, 2022 11:35 AM. If you have any questions, ask your nurse or doctor.          valsartan-hydrochlorothiazide 320-25 MG tablet Commonly known as: DIOVAN-HCT Take 1 tablet by mouth daily. For Blood Pressure         Follow-up: Return in about 1 year (around 09/04/2023).  Claretta Fraise, M.D.

## 2022-09-04 LAB — CBC WITH DIFFERENTIAL/PLATELET
Basophils Absolute: 0 10*3/uL (ref 0.0–0.2)
Basos: 1 %
EOS (ABSOLUTE): 0.1 10*3/uL (ref 0.0–0.4)
Eos: 2 %
Hematocrit: 46.2 % (ref 37.5–51.0)
Hemoglobin: 15.4 g/dL (ref 13.0–17.7)
Immature Grans (Abs): 0 10*3/uL (ref 0.0–0.1)
Immature Granulocytes: 0 %
Lymphocytes Absolute: 1.8 10*3/uL (ref 0.7–3.1)
Lymphs: 31 %
MCH: 31.7 pg (ref 26.6–33.0)
MCHC: 33.3 g/dL (ref 31.5–35.7)
MCV: 95 fL (ref 79–97)
Monocytes Absolute: 0.5 10*3/uL (ref 0.1–0.9)
Monocytes: 8 %
Neutrophils Absolute: 3.5 10*3/uL (ref 1.4–7.0)
Neutrophils: 58 %
Platelets: 257 10*3/uL (ref 150–450)
RBC: 4.86 x10E6/uL (ref 4.14–5.80)
RDW: 12.6 % (ref 11.6–15.4)
WBC: 5.9 10*3/uL (ref 3.4–10.8)

## 2022-09-04 LAB — PSA, TOTAL AND FREE
PSA, Free Pct: 145 %
PSA, Free: 0.29 ng/mL
Prostate Specific Ag, Serum: 0.2 ng/mL (ref 0.0–4.0)

## 2022-09-04 LAB — CMP14+EGFR
ALT: 29 IU/L (ref 0–44)
AST: 36 IU/L (ref 0–40)
Albumin/Globulin Ratio: 2 (ref 1.2–2.2)
Albumin: 4.9 g/dL (ref 3.8–4.9)
Alkaline Phosphatase: 58 IU/L (ref 44–121)
BUN/Creatinine Ratio: 12 (ref 9–20)
BUN: 10 mg/dL (ref 6–24)
Bilirubin Total: 0.4 mg/dL (ref 0.0–1.2)
CO2: 21 mmol/L (ref 20–29)
Calcium: 10.1 mg/dL (ref 8.7–10.2)
Chloride: 98 mmol/L (ref 96–106)
Creatinine, Ser: 0.85 mg/dL (ref 0.76–1.27)
Globulin, Total: 2.5 g/dL (ref 1.5–4.5)
Glucose: 88 mg/dL (ref 70–99)
Potassium: 4.5 mmol/L (ref 3.5–5.2)
Sodium: 140 mmol/L (ref 134–144)
Total Protein: 7.4 g/dL (ref 6.0–8.5)
eGFR: 105 mL/min/{1.73_m2} (ref >59)

## 2022-09-04 LAB — LIPID PANEL
Chol/HDL Ratio: 2 ratio (ref 0.0–5.0)
Cholesterol, Total: 213 mg/dL — ABNORMAL HIGH (ref 100–199)
HDL: 108 mg/dL (ref >39)
LDL Chol Calc (NIH): 92 mg/dL (ref 0–99)
Triglycerides: 77 mg/dL (ref 0–149)
VLDL Cholesterol Cal: 13 mg/dL (ref 5–40)

## 2022-09-04 LAB — VITAMIN D 25 HYDROXY (VIT D DEFICIENCY, FRACTURES): Vit D, 25-Hydroxy: 42.9 ng/mL (ref 30.0–100.0)

## 2022-09-04 NOTE — Progress Notes (Signed)
Hello Jade,  Your lab result is normal and/or stable.Some minor variations that are not significant are commonly marked abnormal, but do not represent any medical problem for you.  Best regards, Claretta Fraise, M.D.

## 2022-10-03 ENCOUNTER — Encounter: Payer: Self-pay | Admitting: Nurse Practitioner

## 2022-10-03 ENCOUNTER — Ambulatory Visit (INDEPENDENT_AMBULATORY_CARE_PROVIDER_SITE_OTHER): Payer: BC Managed Care – PPO

## 2022-10-03 ENCOUNTER — Ambulatory Visit: Payer: BC Managed Care – PPO | Admitting: Nurse Practitioner

## 2022-10-03 VITALS — BP 122/86 | HR 75 | Temp 98.7°F | Ht 65.0 in | Wt 153.0 lb

## 2022-10-03 DIAGNOSIS — J069 Acute upper respiratory infection, unspecified: Secondary | ICD-10-CM

## 2022-10-03 MED ORDER — PREDNISONE 10 MG (21) PO TBPK: ORAL_TABLET | ORAL | 0 refills | Status: DC

## 2022-10-03 NOTE — Progress Notes (Signed)
Acute Office Visit  Subjective:     Patient ID: Roberto Kennedy, male    DOB: 1971/02/24, 51 y.o.   MRN: [id number]  Chief Complaint  Patient presents with   URI   Ear Fullness    URI  The current episode started in the past 7 days. The problem has been unchanged. There has been no fever. Associated symptoms include congestion, coughing and sinus pain. Pertinent negatives include no abdominal pain, headaches, rash, sore throat or wheezing. He has tried acetaminophen and decongestant for the symptoms. The treatment provided mild relief.    Review of Systems  Constitutional: Negative.  Negative for chills and fever.  HENT:  Positive for congestion and sinus pain. Negative for sore throat.   Respiratory:  Positive for cough. Negative for shortness of breath and wheezing.   Cardiovascular: Negative.   Gastrointestinal:  Negative for abdominal pain.  Skin: Negative.  Negative for itching and rash.  Neurological:  Negative for headaches.  All other systems reviewed and are negative.       Objective:    BP 122/86   Pulse 75   Temp 98.7 F (37.1 C)   Ht '5\' 5"'$  (1.651 m)   Wt 153 lb (69.4 kg)   SpO2 98%   BMI 25.46 kg/m  BP Readings from Last 3 Encounters:  10/03/22 122/86  09/03/22 119/81  05/17/21 125/75      Physical Exam Vitals and nursing note reviewed.  Constitutional:      Appearance: Normal appearance.  HENT:     Head: Normocephalic.     Right Ear: External ear normal.     Left Ear: External ear normal.     Nose: Congestion present.     Mouth/Throat:     Mouth: Mucous membranes are moist.  Eyes:     Conjunctiva/sclera: Conjunctivae normal.  Cardiovascular:     Rate and Rhythm: Normal rate and regular rhythm.     Pulses: Normal pulses.     Heart sounds: Normal heart sounds.  Pulmonary:     Effort: Pulmonary effort is normal.     Breath sounds: Normal breath sounds.  Abdominal:     General: Bowel sounds are normal.  Skin:    General: Skin is warm.      Findings: No erythema or rash.  Neurological:     General: No focal deficit present.     Mental Status: He is alert and oriented to person, place, and time.     No results found for any visits on 10/03/22.      Assessment & Plan:  Patient presents with unresolved cough and congestion for the past 3 weeks Take meds as prescribed - Use a cool mist humidifier  -Use saline nose sprays frequently -Force fluids -For fever or aches or pains- take Tylenol or ibuprofen. -completed covid-19, RSV, & Flu swabs with result pending.  -prednisone pack, completed chest x-ray to rule out pneumonia.  Follow up with worsening unresolved symptoms  Problem List Items Addressed This Visit   None Visit Diagnoses     Upper respiratory tract infection, unspecified type    -  Primary   Relevant Medications   predniSONE (STERAPRED UNI-PAK 21 TAB) 10 MG (21) TBPK tablet   Other Relevant Orders   COVID-19, Flu A+B and RSV   DG Chest 2 View       Meds ordered this encounter  Medications   predniSONE (STERAPRED UNI-PAK 21 TAB) 10 MG (21) TBPK tablet    Sig: 6  tablet day 1, 5 tablet day 2, 4 tablet day 3, 3 tablet day 4, 2 tablet day 5, 1 tablet day 6    Dispense:  1 each    Refill:  0    Order Specific Question:   Supervising Provider    Answer:   Jeneen Rinks    Return if symptoms worsen or fail to improve.  Ivy Lynn, NP

## 2022-10-03 NOTE — Patient Instructions (Signed)
Sinus Infection, Adult A sinus infection is soreness and swelling (inflammation) of your sinuses. Sinuses are hollow spaces in the bones around your face. They are located: Around your eyes. In the middle of your forehead. Behind your nose. In your cheekbones. Your sinuses and nasal passages are lined with a fluid called mucus. Mucus drains out of your sinuses. Swelling can trap mucus in your sinuses. This lets germs (bacteria, virus, or fungus) grow, which leads to infection. Most of the time, this condition is caused by a virus. What are the causes? Allergies. Asthma. Germs. Things that block your nose or sinuses. Growths in the nose (nasal polyps). Chemicals or irritants in the air. A fungus. This is rare. What increases the risk? Having a weak body defense system (immune system). Doing a lot of swimming or diving. Using nasal sprays too much. Smoking. What are the signs or symptoms? The main symptoms of this condition are pain and a feeling of pressure around the sinuses. Other symptoms include: Stuffy nose (congestion). This may make it hard to breathe through your nose. Runny nose (drainage). Soreness, swelling, and warmth in the sinuses. A cough that may get worse at night. Being unable to smell and taste. Mucus that collects in the throat or the back of the nose (postnasal drip). This may cause a sore throat or bad breath. Being very tired (fatigued). A fever. How is this diagnosed? Your symptoms. Your medical history. A physical exam. Tests to find out if your condition is short-term (acute) or long-term (chronic). Your doctor may: Check your nose for growths (polyps). Check your sinuses using a tool that has a light on one end (endoscope). Check for allergies or germs. Do imaging tests, such as an MRI or CT scan. How is this treated? Treatment for this condition depends on the cause and whether it is short-term or long-term. If caused by a virus, your symptoms  should go away on their own within 10 days. You may be given medicines to relieve symptoms. They include: Medicines that shrink swollen tissue in the nose. A spray that treats swelling of the nostrils. Rinses that help get rid of thick mucus in your nose (nasal saline washes). Medicines that treat allergies (antihistamines). Over-the-counter pain relievers. If caused by bacteria, your doctor may wait to see if you will get better without treatment. You may be given antibiotic medicine if you have: A very bad infection. A weak body defense system. If caused by growths in the nose, surgery may be needed. Follow these instructions at home: Medicines Take, use, or apply over-the-counter and prescription medicines only as told by your doctor. These may include nasal sprays. If you were prescribed an antibiotic medicine, take it as told by your doctor. Do not stop taking it even if you start to feel better. Hydrate and humidify  Drink enough water to keep your pee (urine) pale yellow. Use a cool mist humidifier to keep the humidity level in your home above 50%. Breathe in steam for 10-15 minutes, 3-4 times a day, or as told by your doctor. You can do this in the bathroom while a hot shower is running. Try not to spend time in cool or dry air. Rest Rest as much as you can. Sleep with your head raised (elevated). Make sure you get enough sleep each night. General instructions  Put a warm, moist washcloth on your face 3-4 times a day, or as often as told by your doctor. Use nasal saline washes as often as   told by your doctor. Wash your hands often with soap and water. If you cannot use soap and water, use hand sanitizer. Do not smoke. Avoid being around people who are smoking (secondhand smoke). Keep all follow-up visits. Contact a doctor if: You have a fever. Your symptoms get worse. Your symptoms do not get better within 10 days. Get help right away if: You have a very bad headache. You  cannot stop vomiting. You have very bad pain or swelling around your face or eyes. You have trouble seeing. You feel confused. Your neck is stiff. You have trouble breathing. These symptoms may be an emergency. Get help right away. Call 911. Do not wait to see if the symptoms will go away. Do not drive yourself to the hospital. Summary A sinus infection is swelling of your sinuses. Sinuses are hollow spaces in the bones around your face. This condition is caused by tissues in your nose that become inflamed or swollen. This traps germs. These can lead to infection. If you were prescribed an antibiotic medicine, take it as told by your doctor. Do not stop taking it even if you start to feel better. Keep all follow-up visits. This information is not intended to replace advice given to you by your health care provider. Make sure you discuss any questions you have with your health care provider. Document Revised: 11/14/2021 Document Reviewed: 11/14/2021 Elsevier Patient Education  [place]. Bronchospasm, Adult  Bronchospasm is when the small airways in the lungs narrow. This can make it very hard to breathe. Swelling and more mucus than normal can add to this problem. What are the causes? Having a cold. Exercise. The smell from sprays, perfumes, candles, and cleaners. Cold air. Stress or strong feelings such as laughing or crying. What increases the risk? Having asthma. Smoking. Being around someone who smokes (secondhand smoke). Having allergies. Being allergic to certain foods, medicine, or bug bites or stings. What are the signs or symptoms? Making a high-pitched whistling sound when you breathe, most often when you breathe out (wheezing). Coughing. A tight feeling in your chest. Feeling like you cannot catch your breath. Feeling like you have no energy to exercise. Breathing that is noisy. A cough that has a high pitch. How is this treated?  Using medicines that you  breathe in (inhale). These open up the airways and help you breathe. Medicines can be taken with a metered dose inhaler or a nebulizer device. Taking medicines to reduce swelling. Getting rid of what started the bronchospasm. Follow these instructions at home: Medicines Take over-the-counter and prescription medicines only as told by your doctor. If you need to use an inhaler or nebulizer to take your medicine, ask your doctor how to use it. You may be given a spacer to use with your inhaler. This makes it easier to get the medicine from the inhaler into your lungs. Lifestyle Do not smoke or use any products that contain nicotine or tobacco. If you need help quitting, ask your doctor. Keep track of things that start your bronchospasm. Avoid these if you can. When pollen, air pollution, or humidity is bad, keep windows closed. Use an air conditioner if you have one. Find ways to cope with stress and your feelings. Activity Some people have bronchospasm when they exercise. This is called exercise-induced bronchoconstriction (EIB). If you have this problem, talk with your doctor about how to deal with EIB. Some tips include: Use your inhaler before exercise. Exercise indoors if it is very cold  or humid, or if the pollen and mold counts are high. Warm up and cool down before and after exercise. Stop your exercise right away if your symptoms start or get worse. General instructions If you have asthma, make sure you have an asthma action plan. Stay up to date on your shots (immunizations). Keep all follow-up visits. Get help right away if: You have trouble breathing. You wheeze and cough and this does not get better after you take medicine. You have chest pain. You have trouble speaking more than one word in a sentence. These symptoms may be an emergency. Get help right away. Call 911. Do not wait to see if the symptoms will go away. Do not drive yourself to the  hospital. Summary Bronchospasm is when the small airways in the lungs narrow. Swelling and more mucus than normal can add to this problem. This can make it very hard to breathe. Do not smoke or use any products that contain nicotine or tobacco. If you need help quitting, ask your doctor. Get help right away if you wheeze and cough and this does not get better after you take medicine. This information is not intended to replace advice given to you by your health care provider. Make sure you discuss any questions you have with your health care provider. Document Revised: 07/03/2021 Document Reviewed: 07/03/2021 Elsevier Patient Education  [place].

## 2022-10-05 LAB — COVID-19, FLU A+B AND RSV
Influenza A, NAA: NOT DETECTED
Influenza B, NAA: NOT DETECTED
RSV, NAA: NOT DETECTED
SARS-CoV-2, NAA: NOT DETECTED

## 2023-02-27 ENCOUNTER — Encounter: Payer: Self-pay | Admitting: Family Medicine

## 2023-02-27 ENCOUNTER — Ambulatory Visit: Payer: BC Managed Care – PPO | Admitting: Family Medicine

## 2023-02-27 VITALS — BP 136/88 | HR 105 | Temp 98.5°F | Ht 65.0 in | Wt 162.4 lb

## 2023-02-27 DIAGNOSIS — J329 Chronic sinusitis, unspecified: Secondary | ICD-10-CM | POA: Diagnosis not present

## 2023-02-27 DIAGNOSIS — J40 Bronchitis, not specified as acute or chronic: Secondary | ICD-10-CM | POA: Diagnosis not present

## 2023-02-27 MED ORDER — AMOXICILLIN-POT CLAVULANATE 875-125 MG PO TABS: 1.0000 | ORAL_TABLET | Freq: Two times a day (BID) | ORAL | 0 refills | Status: DC

## 2023-02-27 NOTE — Progress Notes (Signed)
Chief Complaint  Patient presents with   Nasal Congestion   Cough    HPI  Patient presents today for Patient presents with upper respiratory congestion. Rhinorrhea that is frequently purulent. There is moderate sore throat. Patient reports coughing frequently as well.  white sputum noted. There is no fever, chills or sweats. The patient denies being short of breath. Onset was 5 days ago. Gradually worsening. Tried OTCs without improvement.  PMH: Smoking status noted ROS: Per HPI  Objective: BP 136/88   Pulse (!) 105   Temp 98.5 F (36.9 C)   Ht '5\' 5"'$  (1.651 m)   Wt 162 lb 6.4 oz (73.7 kg)   SpO2 97%   BMI 27.02 kg/m  Gen: NAD, alert, cooperative with exam HEENT: NCAT, Nasal passages swollen, red TMS RED CV: RRR, good S1/S2, no murmur Resp: Bronchitis changes with scattered wheezes, non-labored Ext: No edema, warm Neuro: Alert and oriented, No gross deficits  Assessment and plan:  1. Sinobronchitis     Meds ordered this encounter  Medications   amoxicillin-clavulanate (AUGMENTIN) 875-125 MG tablet    Sig: Take 1 tablet by mouth 2 (two) times daily. Take all of this medication    Dispense:  20 tablet    Refill:  0    No orders of the defined types were placed in this encounter.   Follow up as needed.  Claretta Fraise, MD

## 2023-08-09 ENCOUNTER — Other Ambulatory Visit: Payer: Self-pay | Admitting: Family Medicine

## 2023-08-09 ENCOUNTER — Encounter: Payer: Self-pay | Admitting: Family Medicine

## 2023-08-09 NOTE — Telephone Encounter (Signed)
Lmtcb letter mailed ?

## 2023-08-09 NOTE — Telephone Encounter (Signed)
Stacks NTBS in Sept for yearly FU. Last OV 09/03/22. 30 days sent to pharmacy

## 2023-08-20 ENCOUNTER — Ambulatory Visit: Payer: BC Managed Care – PPO | Admitting: Family Medicine

## 2023-08-20 ENCOUNTER — Encounter: Payer: Self-pay | Admitting: Family Medicine

## 2023-08-20 VITALS — BP 135/86 | HR 82 | Temp 98.4°F | Ht 65.0 in | Wt 162.0 lb

## 2023-08-20 DIAGNOSIS — Z13220 Encounter for screening for lipoid disorders: Secondary | ICD-10-CM

## 2023-08-20 DIAGNOSIS — I10 Essential (primary) hypertension: Secondary | ICD-10-CM | POA: Diagnosis not present

## 2023-08-20 MED ORDER — VALSARTAN-HYDROCHLOROTHIAZIDE 320-25 MG PO TABS: 1.0000 | ORAL_TABLET | Freq: Every day | ORAL | 3 refills | Status: DC

## 2023-08-20 NOTE — Progress Notes (Signed)
Subjective:  Patient ID: Roberto Kennedy, male    DOB: 1971-10-29  Age: 52 y.o. MRN: [id number]  CC: Medical Management of Chronic Issues   HPI [name] presents for  presents for  follow-up of hypertension. Patient has no history of headache chest pain or shortness of breath or recent cough. Patient also denies symptoms of TIA such as focal numbness or weakness. Patient denies side effects from medication. States taking it regularly.      08/20/2023    3:08 PM 02/27/2023    2:56 PM 05/17/2021    1:01 PM  Depression screen PHQ 2/9  Decreased Interest 0 0 0  Down, Depressed, Hopeless 0 0 0  PHQ - 2 Score 0 0 0    History Tobechukwu has a past medical history of Allergy and Hypertension.   He has a past surgical history that includes Colonoscopy (05/09/2011).   His family history includes Colon cancer (age of onset: 13) in his father.He reports that he has never smoked. He has never used smokeless tobacco. He reports current alcohol use of about 1.0 standard drink of alcohol per week. He reports that he does not use drugs.    ROS Review of Systems  Constitutional:  Negative for fever.  Respiratory:  Negative for shortness of breath.   Cardiovascular:  Negative for chest pain.  Musculoskeletal:  Negative for arthralgias.  Skin:  Negative for rash.    Objective:  BP 135/86   Pulse 82   Temp 98.4 F (36.9 C)   Ht 5\' 5"  (1.651 m)   Wt 162 lb (73.5 kg)   BMI 26.96 kg/m   BP Readings from Last 3 Encounters:  08/20/23 135/86  02/27/23 136/88  10/03/22 122/86    Wt Readings from Last 3 Encounters:  08/20/23 162 lb (73.5 kg)  02/27/23 162 lb 6.4 oz (73.7 kg)  10/03/22 153 lb (69.4 kg)     Physical Exam Vitals reviewed.  Constitutional:      Appearance: He is well-developed.  HENT:     Head: Normocephalic and atraumatic.     Right Ear: External ear normal.     Left Ear: External ear normal.     Mouth/Throat:     Pharynx: No oropharyngeal exudate or posterior  oropharyngeal erythema.  Eyes:     Pupils: Pupils are equal, round, and reactive to light.  Cardiovascular:     Rate and Rhythm: Normal rate and regular rhythm.     Heart sounds: No murmur heard. Pulmonary:     Effort: No respiratory distress.     Breath sounds: Normal breath sounds.  Musculoskeletal:     Cervical back: Normal range of motion and neck supple.  Neurological:     Mental Status: He is alert and oriented to person, place, and time.       Assessment & Plan:   Norvell was seen today for medical management of chronic issues.  Diagnoses and all orders for this visit:  Primary hypertension  Lipid screening  Other orders -     valsartan-hydrochlorothiazide (DIOVAN-HCT) 320-25 MG tablet; Take 1 tablet by mouth daily. For Blood Pressure       I have discontinued Roberto Kennedy's amoxicillin-clavulanate. I have also changed his valsartan-hydrochlorothiazide.  Allergies as of 08/20/2023       Reactions   Lactose Intolerance (gi) Diarrhea   Sulfa Antibiotics Nausea Only        Medication List        Accurate as of August 20, 2023  3:53 PM. If you have any questions, ask your nurse or doctor.          STOP taking these medications    amoxicillin-clavulanate 875-125 MG tablet Commonly known as: AUGMENTIN Stopped by: Devinne Epstein       TAKE these medications    valsartan-hydrochlorothiazide 320-25 MG tablet Commonly known as: DIOVAN-HCT Take 1 tablet by mouth daily. For Blood Pressure         Follow-up: Return in about 6 weeks (around 10/01/2023) for [facility] physical.  Mechele Claude, M.D.

## 2023-10-03 ENCOUNTER — Encounter: Payer: Self-pay | Admitting: Family Medicine

## 2023-10-03 ENCOUNTER — Ambulatory Visit: Payer: BC Managed Care – PPO | Admitting: Family Medicine

## 2023-10-03 VITALS — BP 127/80 | HR 86 | Temp 97.7°F | Ht 65.0 in | Wt 160.8 lb

## 2023-10-03 DIAGNOSIS — I10 Essential (primary) hypertension: Secondary | ICD-10-CM | POA: Diagnosis not present

## 2023-10-03 DIAGNOSIS — Z1211 Encounter for screening for malignant neoplasm of colon: Secondary | ICD-10-CM

## 2023-10-03 DIAGNOSIS — Z23 Encounter for immunization: Secondary | ICD-10-CM

## 2023-10-03 DIAGNOSIS — Z0001 Encounter for general adult medical examination with abnormal findings: Secondary | ICD-10-CM

## 2023-10-03 DIAGNOSIS — Z13220 Encounter for screening for lipoid disorders: Secondary | ICD-10-CM

## 2023-10-03 DIAGNOSIS — Z0000 Encounter for general adult medical examination without abnormal findings: Secondary | ICD-10-CM

## 2023-10-03 DIAGNOSIS — E559 Vitamin D deficiency, unspecified: Secondary | ICD-10-CM

## 2023-10-03 DIAGNOSIS — Z125 Encounter for screening for malignant neoplasm of prostate: Secondary | ICD-10-CM

## 2023-10-03 LAB — URINALYSIS
Bilirubin, UA: NEGATIVE
Glucose, UA: NEGATIVE
Leukocytes,UA: NEGATIVE
Nitrite, UA: NEGATIVE
Protein,UA: NEGATIVE
RBC, UA: NEGATIVE
Specific Gravity, UA: 1.01 (ref 1.005–1.030)
Urobilinogen, Ur: 0.2 mg/dL (ref 0.2–1.0)
pH, UA: 6 (ref 5.0–7.5)

## 2023-10-03 MED ORDER — IMIQUIMOD 5 % EX CREA: TOPICAL_CREAM | CUTANEOUS | 0 refills | Status: DC

## 2023-10-03 MED ORDER — MOMETASONE FUROATE 0.1 % EX CREA: TOPICAL_CREAM | CUTANEOUS | 1 refills | Status: DC

## 2023-10-03 NOTE — Progress Notes (Signed)
Subjective:  Patient ID: Roberto Kennedy, male    DOB: Jul 19, 1971  Age: 52 y.o. MRN: [id number]  CC: Annual Exam   HPI Roberto Kennedy presents for Annual exam.   presents for  follow-up of hypertension. Patient has no history of headache chest pain or shortness of breath or recent cough. Patient also denies symptoms of TIA such as focal numbness or weakness. Patient denies side effects from medication. States taking it regularly.      10/03/2023    9:26 AM 08/20/2023    3:08 PM 02/27/2023    2:56 PM  Depression screen PHQ 2/9  Decreased Interest 0 0 0  Down, Depressed, Hopeless 0 0 0  PHQ - 2 Score 0 0 0    History Roberto Kennedy has a past medical history of Allergy and Hypertension.   Roberto Kennedy has a past surgical history that includes Colonoscopy (05/09/2011).   His family history includes Colon cancer (age of onset: 49) in his father.Roberto Kennedy reports that Roberto Kennedy has never smoked. Roberto Kennedy has never used smokeless tobacco. Roberto Kennedy reports current alcohol use of about 1.0 standard drink of alcohol per week. Roberto Kennedy reports that Roberto Kennedy does not use drugs.    ROS Review of Systems  Constitutional:  Negative for activity change, fatigue and unexpected weight change.  HENT:  Negative for congestion, ear pain, hearing loss, postnasal drip and trouble swallowing.   Eyes:  Negative for pain and visual disturbance.  Respiratory:  Negative for cough, chest tightness and shortness of breath.   Cardiovascular:  Negative for chest pain, palpitations and leg swelling.  Gastrointestinal:  Negative for abdominal distention, abdominal pain, blood in stool, constipation, diarrhea, nausea and vomiting.  Endocrine: Negative for cold intolerance, heat intolerance and polydipsia.  Genitourinary:  Negative for difficulty urinating, dysuria, flank pain, frequency and urgency.  Musculoskeletal:  Negative for arthralgias and joint swelling.  Skin:  Negative for color change, rash and wound.  Neurological:  Negative for dizziness, syncope, speech  difficulty, weakness, light-headedness, numbness and headaches.  Hematological:  Does not bruise/bleed easily.  Psychiatric/Behavioral:  Negative for confusion, decreased concentration, dysphoric mood and sleep disturbance. The patient is not nervous/anxious.     Objective:  BP 127/80   Pulse 86   Temp 97.7 F (36.5 C)   Ht 5\' 5"  (1.651 m)   Wt 160 lb 12.8 oz (72.9 kg)   SpO2 99%   BMI 26.76 kg/m   BP Readings from Last 3 Encounters:  10/03/23 127/80  08/20/23 135/86  02/27/23 136/88    Wt Readings from Last 3 Encounters:  10/03/23 160 lb 12.8 oz (72.9 kg)  08/20/23 162 lb (73.5 kg)  02/27/23 162 lb 6.4 oz (73.7 kg)     Physical Exam Constitutional:      Appearance: Roberto Kennedy is well-developed.  HENT:     Head: Normocephalic and atraumatic.  Eyes:     Pupils: Pupils are equal, round, and reactive to light.  Neck:     Thyroid: No thyromegaly.     Trachea: No tracheal deviation.  Cardiovascular:     Rate and Rhythm: Normal rate and regular rhythm.     Heart sounds: Normal heart sounds. No murmur heard.    No friction rub. No gallop.  Pulmonary:     Breath sounds: Normal breath sounds. No wheezing or rales.  Abdominal:     General: Bowel sounds are normal. There is no distension.     Palpations: Abdomen is soft. There is no mass.     Tenderness: There  is no abdominal tenderness.     Hernia: There is no hernia in the left inguinal area.  Genitourinary:    Penis: Normal.      Testes: Normal.  Musculoskeletal:        General: Normal range of motion.     Cervical back: Normal range of motion.  Lymphadenopathy:     Cervical: No cervical adenopathy.  Skin:    General: Skin is warm and dry.  Neurological:     Mental Status: Roberto Kennedy is alert and oriented to person, place, and time.       Assessment & Plan:   Roberto Kennedy was seen today for annual exam.  Diagnoses and all orders for this visit:  Well adult exam -     CBC with Differential/Platelet -     CMP14+EGFR -      Lipid panel -     PSA, total and free -     Urinalysis -     VITAMIN D 25 Hydroxy (Vit-D Deficiency, Fractures)  Primary hypertension -     CBC with Differential/Platelet -     CMP14+EGFR -     Urinalysis  Lipid screening -     Lipid panel  Vitamin D deficiency -     VITAMIN D 25 Hydroxy (Vit-D Deficiency, Fractures)  Prostate cancer screening -     PSA, total and free  Other orders -     imiquimod (ALDARA) 5 % cream; Apply topically 3 (three) times a week. -     mometasone (ELOCON) 0.1 % cream; Apply to forehead area daily for 2 weeks.       I am having Roberto Kennedy start on imiquimod and mometasone. I am also having him maintain his valsartan-hydrochlorothiazide.  Allergies as of 10/03/2023       Reactions   Lactose Intolerance (gi) Diarrhea   Sulfa Antibiotics Nausea Only        Medication List        Accurate as of October 03, 2023 10:21 AM. If you have any questions, ask your nurse or doctor.          imiquimod 5 % cream Commonly known as: ALDARA Apply topically 3 (three) times a week. Start taking on: October 04, 2023 Started by: Denese Mentink   mometasone 0.1 % cream Commonly known as: ELOCON Apply to forehead area daily for 2 weeks. Started by: Dondrea Clendenin   valsartan-hydrochlorothiazide 320-25 MG tablet Commonly known as: DIOVAN-HCT Take 1 tablet by mouth daily. For Blood Pressure         Follow-up: No follow-ups on file.  Mechele Claude, M.D.

## 2023-10-04 LAB — CMP14+EGFR
ALT: 45 [IU]/L — ABNORMAL HIGH (ref 0–44)
AST: 46 [IU]/L — ABNORMAL HIGH (ref 0–40)
Albumin: 4.9 g/dL (ref 3.8–4.9)
Alkaline Phosphatase: 65 [IU]/L (ref 44–121)
BUN/Creatinine Ratio: 15 (ref 9–20)
BUN: 16 mg/dL (ref 6–24)
Bilirubin Total: 0.7 mg/dL (ref 0.0–1.2)
CO2: 23 mmol/L (ref 20–29)
Calcium: 10.1 mg/dL (ref 8.7–10.2)
Chloride: 95 mmol/L — ABNORMAL LOW (ref 96–106)
Creatinine, Ser: 1.04 mg/dL (ref 0.76–1.27)
Globulin, Total: 2.5 g/dL (ref 1.5–4.5)
Glucose: 89 mg/dL (ref 70–99)
Potassium: 4.3 mmol/L (ref 3.5–5.2)
Sodium: 138 mmol/L (ref 134–144)
Total Protein: 7.4 g/dL (ref 6.0–8.5)
eGFR: 86 mL/min/{1.73_m2} (ref >59)

## 2023-10-04 LAB — CBC WITH DIFFERENTIAL/PLATELET
Basophils Absolute: 0 10*3/uL (ref 0.0–0.2)
Basos: 1 %
EOS (ABSOLUTE): 0.1 10*3/uL (ref 0.0–0.4)
Eos: 1 %
Hematocrit: 46 % (ref 37.5–51.0)
Hemoglobin: 15.2 g/dL (ref 13.0–17.7)
Immature Grans (Abs): 0 10*3/uL (ref 0.0–0.1)
Immature Granulocytes: 0 %
Lymphocytes Absolute: 1.7 10*3/uL (ref 0.7–3.1)
Lymphs: 25 %
MCH: 32.1 pg (ref 26.6–33.0)
MCHC: 33 g/dL (ref 31.5–35.7)
MCV: 97 fL (ref 79–97)
Monocytes Absolute: 0.8 10*3/uL (ref 0.1–0.9)
Monocytes: 13 %
Neutrophils Absolute: 4 10*3/uL (ref 1.4–7.0)
Neutrophils: 60 %
Platelets: 241 10*3/uL (ref 150–450)
RBC: 4.73 x10E6/uL (ref 4.14–5.80)
RDW: 11.8 % (ref 11.6–15.4)
WBC: 6.7 10*3/uL (ref 3.4–10.8)

## 2023-10-04 LAB — LIPID PANEL
Chol/HDL Ratio: 2.3 {ratio} (ref 0.0–5.0)
Cholesterol, Total: 241 mg/dL — ABNORMAL HIGH (ref 100–199)
HDL: 106 mg/dL (ref >39)
LDL Chol Calc (NIH): 123 mg/dL — ABNORMAL HIGH (ref 0–99)
Triglycerides: 73 mg/dL (ref 0–149)
VLDL Cholesterol Cal: 12 mg/dL (ref 5–40)

## 2023-10-04 LAB — PSA, TOTAL AND FREE
PSA, Free Pct: 48 %
PSA, Free: 0.24 ng/mL
Prostate Specific Ag, Serum: 0.5 ng/mL (ref 0.0–4.0)

## 2023-10-04 LAB — VITAMIN D 25 HYDROXY (VIT D DEFICIENCY, FRACTURES): Vit D, 25-Hydroxy: 44.2 ng/mL (ref 30.0–100.0)

## 2023-10-31 ENCOUNTER — Encounter: Payer: Self-pay | Admitting: Family Medicine

## 2023-10-31 ENCOUNTER — Ambulatory Visit: Payer: BC Managed Care – PPO | Admitting: Family Medicine

## 2023-10-31 VITALS — BP 128/83 | HR 118 | Temp 98.0°F | Ht 65.0 in | Wt 162.2 lb

## 2023-10-31 DIAGNOSIS — J0100 Acute maxillary sinusitis, unspecified: Secondary | ICD-10-CM | POA: Diagnosis not present

## 2023-10-31 MED ORDER — ROSUVASTATIN CALCIUM 10 MG PO TABS: 10.0000 mg | ORAL_TABLET | Freq: Every day | ORAL | 1 refills | Status: DC

## 2023-10-31 MED ORDER — PSEUDOEPHEDRINE-GUAIFENESIN ER 120-1200 MG PO TB12: 1.0000 | ORAL_TABLET | Freq: Two times a day (BID) | ORAL | 0 refills | Status: DC

## 2023-10-31 MED ORDER — BETAMETHASONE SOD PHOS & ACET 6 (3-3) MG/ML IJ SUSP
6.0000 mg | Freq: Once | INTRAMUSCULAR | Status: AC
Start: 2023-10-31 — End: 2023-10-31
  Administered 2023-10-31: 6 mg via INTRAMUSCULAR

## 2023-10-31 MED ORDER — MOXIFLOXACIN HCL 400 MG PO TABS: 400.0000 mg | ORAL_TABLET | Freq: Every day | ORAL | 0 refills | Status: DC

## 2023-10-31 NOTE — Progress Notes (Signed)
Chief Complaint  Patient presents with   Sinusitis    HPI  Patient presents today for Symptoms include congestion, facial pain, nasal congestion, non productive cough, post nasal drip and Severe HA from sinus pressure. There is no fever, chills, or sweats. Onset of symptoms was a few days ago, gradually worsening since that time.   PMH: Smoking status noted ROS: Per HPI  Objective: BP 128/83   Pulse (!) 118   Temp 98 F (36.7 C)   Ht 5\' 5"  (1.651 m)   Wt 162 lb 3.2 oz (73.6 kg)   SpO2 98%   BMI 26.99 kg/m  Gen: NAD, alert, cooperative with exam. Nasal passages swollen, red. Max sinuses tender HEENT: NCAT, EOMI, PERRL CV: RRR, good S1/S2, no murmur Resp: CTABL, no wheezes, non-labored Ext: No edema, warm Neuro: Alert and oriented, No gross deficits  Assessment and plan:  1. Acute maxillary sinusitis, recurrence not specified     Meds ordered this encounter  Medications   rosuvastatin (CRESTOR) 10 MG tablet    Sig: Take 1 tablet (10 mg total) by mouth daily. For cholesterol    Dispense:  90 tablet    Refill:  1   betamethasone acetate-betamethasone sodium phosphate (CELESTONE) injection 6 mg   Pseudoephedrine-Guaifenesin [phone] MG TB12    Sig: Take 1 tablet by mouth 2 (two) times daily. For congestion    Dispense:  10 tablet    Refill:  0   moxifloxacin (AVELOX) 400 MG tablet    Sig: Take 1 tablet (400 mg total) by mouth daily.    Dispense:  10 tablet    Refill:  0      Follow up as needed.  Mechele Claude, MD

## 2024-03-09 ENCOUNTER — Encounter: Payer: Self-pay | Admitting: Nurse Practitioner

## 2024-03-09 ENCOUNTER — Ambulatory Visit: Admitting: Nurse Practitioner

## 2024-03-09 VITALS — BP 121/86 | HR 76 | Temp 98.2°F | Ht 66.0 in | Wt 163.8 lb

## 2024-03-09 DIAGNOSIS — J069 Acute upper respiratory infection, unspecified: Secondary | ICD-10-CM | POA: Diagnosis not present

## 2024-03-09 MED ORDER — HYDROCODONE BIT-HOMATROP MBR 5-1.5 MG/5ML PO SOLN: 5.0000 mL | Freq: Four times a day (QID) | ORAL | 0 refills | Status: DC | PRN

## 2024-03-09 MED ORDER — PREDNISONE 20 MG PO TABS: 40.0000 mg | ORAL_TABLET | Freq: Every day | ORAL | 0 refills | Status: AC

## 2024-03-09 MED ORDER — AZITHROMYCIN 250 MG PO TABS
ORAL_TABLET | ORAL | 0 refills | Status: DC
Start: 2024-03-09 — End: 2024-03-19

## 2024-03-09 NOTE — Progress Notes (Signed)
 Subjective:    Patient ID: Roberto Kennedy, male    DOB: [birth year], 53 y.o.   MRN: [id number]   Chief Complaint: Fever (03/07 started with fever, vomiting, diarrhea, productive cough. )   Fever  Associated symptoms include congestion. Pertinent negatives include no abdominal pain, chest pain, headaches or rash.    Patient comes in today having been sick for 10 days.he has had intermittent fever, fatigue, cough, headache and diarrhea. He feels some better today. He has used mucinex DM, tylenol and is not much better.  Patient Active Problem List   Diagnosis Date Noted   Lactose intolerance 09/17/2019   Basal cell carcinoma, arm, left 08/27/2018   Hypertension 07/07/2013   Allergic rhinitis 07/07/2013       Review of Systems  Constitutional:  Positive for fever (100). Negative for diaphoresis.  HENT:  Positive for congestion.   Eyes:  Negative for pain.  Respiratory:  Negative for shortness of breath.   Cardiovascular:  Negative for chest pain, palpitations and leg swelling.  Gastrointestinal:  Negative for abdominal pain.  Endocrine: Negative for polydipsia.  Skin:  Negative for rash.  Neurological:  Negative for dizziness, weakness and headaches.  Hematological:  Does not bruise/bleed easily.  All other systems reviewed and are negative.      Objective:   Physical Exam Constitutional:      Appearance: Normal appearance.  Cardiovascular:     Rate and Rhythm: Normal rate and regular rhythm.     Heart sounds: Normal heart sounds.  Pulmonary:     Effort: Pulmonary effort is normal.     Breath sounds: Normal breath sounds.  Skin:    General: Skin is warm.  Neurological:     General: No focal deficit present.     Mental Status: He is alert and oriented to person, place, and time.  Psychiatric:        Mood and Affect: Mood normal.        Behavior: Behavior normal.     BP 121/86   Pulse 76   Temp 98.2 F (36.8 C)   Ht 5\' 6"  (1.676 m)   Wt 163 lb 12.8 oz (74.3  kg)   SpO2 97%   BMI 26.44 kg/m        Assessment & Plan:  [name] in today with chief complaint of Fever (03/07 started with fever, headache, vomiting, diarrhea, productive cough. )   1. URI with cough and congestion (Primary) 1. Take meds as prescribed 2. Use a cool mist humidifier especially during the winter months and when heat has been humid. 3. Use saline nose sprays frequently 4. Saline irrigations of the nose can be very helpful if done frequently.  * 4X daily for 1 week*  * Use of a nettie pot can be helpful with this. Follow directions with this* 5. Drink plenty of fluids 6. Keep thermostat turn down low 7.For any cough or congestion- hycodan 8. For fever or aces or pains- take tylenol or ibuprofen appropriate for age and weight.  * for fevers greater than 101 orally you may alternate ibuprofen and tylenol every  3 hours.    - HYDROcodone bit-homatropine (HYCODAN) 5-1.5 MG/5ML syrup; Take 5 mLs by mouth every 6 (six) hours as needed for cough.  Dispense: 120 mL; Refill: 0 - predniSONE (DELTASONE) 20 MG tablet; Take 2 tablets (40 mg total) by mouth daily with breakfast for 5 days. 2 po daily for 5 days  Dispense: 10 tablet; Refill: 0 - azithromycin (  ZITHROMAX Z-PAK) 250 MG tablet; As directed  Dispense: 6 tablet; Refill: 0    The above assessment and management plan was discussed with the patient. The patient verbalized understanding of and has agreed to the management plan. Patient is aware to call the clinic if symptoms persist or worsen. Patient is aware when to return to the clinic for a follow-up visit. Patient educated on when it is appropriate to go to the emergency department.   [name], FNP

## 2024-03-09 NOTE — Patient Instructions (Signed)

## 2024-03-18 ENCOUNTER — Ambulatory Visit: Payer: Self-pay

## 2024-03-18 NOTE — Telephone Encounter (Signed)
 Scheduled with DOD tomorrow. LS

## 2024-03-18 NOTE — Telephone Encounter (Signed)
 Copied from CRM [id number]. Topic: Clinical - Red Word Triage >> Mar 18, 2024 12:09 PM Elle L wrote: Red Word that prompted transfer to Nurse Triage: The patient has been having worsening symptoms since his last appointment on 3/17. He states it first started on 3/7. His fever and blood pressure is continuing to go up and down.    Chief Complaint: Seen 03/09/24 OV with URI. Felt better then Sunday symptoms came back. Fever, diarrhea, some congestion. Declined OV. "Can she call in more medication?" Symptoms: above Frequency: Sunday ertinent Negatives: Patient denies  Disposition: []ED /[]Urgent Care (no appt availability in office) / []Appointment(In office/virtual)/ [] [facility]/ []Home Care/ []Refused Recommended Disposition /[x][facility]/ [x] Follow-up with PCP Additional Notes: Please advise pt.  Reason for Disposition  Fever present > 3 days (72 hours)  Answer Assessment - Initial Assessment Questions 1. TEMPERATURE: "What is the most recent temperature?"  "How was it measured?"      10 0 2. ONSET: "When did the fever start?"      Sunday 3. CHILLS: "Do you have chills?" If yes: "How bad are they?"  (e.g., none, mild, moderate, severe)   - NONE: no chills   - MILD: feeling cold   - MODERATE: feeling very cold, some shivering (feels better under a thick blanket)   - SEVERE: feeling extremely cold with shaking chills (general body shaking, rigors; even under a thick blanket)      No 4. OTHER SYMPTOMS: "Do you have any other symptoms besides the fever?"  (e.g., abdomen pain, cough, diarrhea, earache, headache, sore throat, urination pain)     Diarrhea, BP 118/84 and up and down 5. CAUSE: If there are no symptoms, ask: "What do you think is causing the fever?"      Unsure 6. CONTACTS: "Does anyone else in the family have an infection?"     Wife 7. TREATMENT: "What have you done so far to treat this fever?" (e.g., medications)     OTC 8. IMMUNOCOMPROMISE: "Do  you have of the following: diabetes, HIV positive, splenectomy, cancer chemotherapy, chronic steroid treatment, transplant patient, etc."     No 9. PREGNANCY: "Is there any chance you are pregnant?" "When was your last menstrual period?"     N/a 10. TRAVEL: "Have you traveled out of the country in the last month?" (e.g., travel history, exposures)       No  Protocols used: [facility]

## 2024-03-18 NOTE — Telephone Encounter (Signed)
Pt. Needs to be seen for this. Thanks, WS 

## 2024-03-19 ENCOUNTER — Ambulatory Visit: Admitting: Nurse Practitioner

## 2024-03-19 ENCOUNTER — Encounter: Payer: Self-pay | Admitting: Nurse Practitioner

## 2024-03-19 VITALS — BP 126/74 | HR 106 | Temp 97.8°F | Ht 66.0 in | Wt 167.0 lb

## 2024-03-19 DIAGNOSIS — R051 Acute cough: Secondary | ICD-10-CM | POA: Diagnosis not present

## 2024-03-19 MED ORDER — LEVOFLOXACIN 500 MG PO TABS: 500.0000 mg | ORAL_TABLET | Freq: Every day | ORAL | 0 refills | Status: AC

## 2024-03-19 NOTE — Progress Notes (Signed)
   Subjective:    Patient ID: Roberto Kennedy, male    DOB: Aug 09, 1971, 53 y.o.   MRN: [id number]   Chief Complaint: Still feeling sick (Seen on march 17)   HPI  Patient was seen on 03/09/24 and was dx with URI with ciugh and congestion. He was treated with hycodan, zithromax and prednisone. He got better but 2 days later her developed a fever again. Still coughing. Patient Active Problem List   Diagnosis Date Noted   Lactose intolerance 09/17/2019   Basal cell carcinoma, arm, left 08/27/2018   Hypertension 07/07/2013   Allergic rhinitis 07/07/2013       Review of Systems  Constitutional:  Positive for chills and fever.  HENT:  Positive for congestion.   Respiratory:  Positive for cough. Negative for shortness of breath.        Objective:   Physical Exam Constitutional:      Appearance: Normal appearance.  Cardiovascular:     Rate and Rhythm: Normal rate and regular rhythm.     Heart sounds: Normal heart sounds.  Pulmonary:     Effort: Pulmonary effort is normal.     Breath sounds: Normal breath sounds.  Skin:    General: Skin is warm.  Neurological:     General: No focal deficit present.     Mental Status: He is alert and oriented to person, place, and time.  Psychiatric:        Mood and Affect: Mood normal.        Behavior: Behavior normal.     BP 126/74   Pulse (!) 106   Temp 97.8 F (36.6 C) (Temporal)   Ht 5\' 6"  (1.676 m)   Wt 167 lb (75.8 kg)   SpO2 98%   BMI 26.95 kg/m        Assessment & Plan:   Roberto Kennedy in today with chief complaint of Still feeling sick (Seen on march 17)   1. Acute cough (Primary) Due to redevelopment of fever will treat empirically with levaquin He will do a home covid test at home just to make sure that is not what is going on Force fluids  Rest  RTOprn  - levofloxacin (LEVAQUIN) 500 MG tablet; Take 1 tablet (500 mg total) by mouth daily for 7 days.  Dispense: 7 tablet; Refill: 0    The above assessment and  management plan was discussed with the patient. The patient verbalized understanding of and has agreed to the management plan. Patient is aware to call the clinic if symptoms persist or worsen. Patient is aware when to return to the clinic for a follow-up visit. Patient educated on when it is appropriate to go to the emergency department.   Roberto Daphine Deutscher, FNP

## 2024-03-19 NOTE — Patient Instructions (Signed)

## 2024-04-16 LAB — HM COLONOSCOPY

## 2024-04-20 ENCOUNTER — Other Ambulatory Visit: Payer: Self-pay | Admitting: Family Medicine

## 2024-05-12 ENCOUNTER — Encounter: Payer: Self-pay | Admitting: Family Medicine

## 2024-05-12 ENCOUNTER — Other Ambulatory Visit: Payer: Self-pay | Admitting: Family Medicine

## 2024-05-12 NOTE — Telephone Encounter (Signed)
 Stacks pt NTBS 30-d given 04/20/24

## 2024-05-12 NOTE — Telephone Encounter (Signed)
 LMTCB to schedule appt Letter mailed

## 2024-05-25 ENCOUNTER — Encounter: Payer: Self-pay | Admitting: Family Medicine

## 2024-05-25 ENCOUNTER — Ambulatory Visit: Admitting: Family Medicine

## 2024-05-25 VITALS — BP 112/65 | HR 105 | Temp 97.6°F | Ht 66.0 in | Wt 169.0 lb

## 2024-05-25 DIAGNOSIS — E782 Mixed hyperlipidemia: Secondary | ICD-10-CM | POA: Diagnosis not present

## 2024-05-25 DIAGNOSIS — M25512 Pain in left shoulder: Secondary | ICD-10-CM

## 2024-05-25 DIAGNOSIS — I10 Essential (primary) hypertension: Secondary | ICD-10-CM

## 2024-05-25 MED ORDER — VALSARTAN-HYDROCHLOROTHIAZIDE 320-25 MG PO TABS: 1.0000 | ORAL_TABLET | Freq: Every day | ORAL | 3 refills | Status: AC

## 2024-05-25 MED ORDER — ROSUVASTATIN CALCIUM 10 MG PO TABS: 10.0000 mg | ORAL_TABLET | Freq: Every day | ORAL | 0 refills | Status: DC

## 2024-05-25 MED ORDER — DICLOFENAC SODIUM 75 MG PO TBEC: 75.0000 mg | DELAYED_RELEASE_TABLET | Freq: Two times a day (BID) | ORAL | 2 refills | Status: DC

## 2024-05-25 NOTE — Progress Notes (Signed)
 Subjective:  Patient ID: Roberto Kennedy,  male    DOB: 1971-02-06  Age: 53 y.o.    CC: Medication Refill (pended) and Shoulder Pain (Left shoulder pain. No injury. Started about 2 months ago.  ROM effect. Hurts to reach up or reach around.  Some popping. No swelling.  Aching all the time but sharp when it catches. )  Let shoulder pain with abduction over 60 degrees. Onset 2 mos ago.NKI. Right handed.  HPI Pearl Berlinger presents for  follow-up of hypertension. Patient has no history of headache chest pain or shortness of breath or recent cough. Patient also denies symptoms of TIA such as numbness weakness lateralizing. Patient denies side effects from medication. States taking it regularly.  Patient also  in for follow-up of elevated cholesterol. Doing well without complaints on current medication. Denies side effects  including myalgia and arthralgia and nausea. Also in today for liver function testing. Currently no chest pain, shortness of breath or other cardiovascular related symptoms noted.     History Zakari has a past medical history of Allergy and Hypertension.   He has a past surgical history that includes Colonoscopy (05/09/2011).   His family history includes Colon cancer (age of onset: 36) in his father.He reports that he has never smoked. He has never used smokeless tobacco. He reports current alcohol use of about 1.0 standard drink of alcohol per week. He reports that he does not use drugs.  No current outpatient medications on file prior to visit.   No current facility-administered medications on file prior to visit.    ROS Review of Systems  Constitutional:  Negative for fever.  Respiratory:  Negative for shortness of breath.   Cardiovascular:  Negative for chest pain.  Musculoskeletal:  Positive for arthralgias (dull ache, left shoulder).  Skin:  Negative for rash.    Objective:  BP 112/65   Pulse (!) 105   Temp 97.6 F (36.4 C)   Ht 5\' 6"  (1.676 m)   Wt 169  lb (76.7 kg)   SpO2 96%   BMI 27.28 kg/m   BP Readings from Last 3 Encounters:  05/25/24 112/65  03/19/24 126/74  03/09/24 121/86    Wt Readings from Last 3 Encounters:  05/25/24 169 lb (76.7 kg)  03/19/24 167 lb (75.8 kg)  03/09/24 163 lb 12.8 oz (74.3 kg)    No results found for: "HGBA1C"  Physical Exam Vitals reviewed.  Constitutional:      Appearance: He is well-developed.  HENT:     Head: Normocephalic and atraumatic.     Right Ear: External ear normal.     Left Ear: External ear normal.     Mouth/Throat:     Pharynx: No oropharyngeal exudate or posterior oropharyngeal erythema.  Eyes:     Pupils: Pupils are equal, round, and reactive to light.  Cardiovascular:     Rate and Rhythm: Normal rate and regular rhythm.     Heart sounds: No murmur heard. Pulmonary:     Effort: No respiratory distress.     Breath sounds: Normal breath sounds.  Musculoskeletal:        General: Tenderness (left anterior shoulder with 60 degrees abduction and painful abducted rotation) present.     Cervical back: Normal range of motion and neck supple.  Neurological:     Mental Status: He is alert and oriented to person, place, and time.         Assessment & Plan:  Primary hypertension -  Valsartan -hydroCHLOROthiazide ; Take 1 tablet by mouth daily. For Blood Pressure  Dispense: 90 tablet; Refill: 3 -     CBC with Differential/Platelet -     CMP14+EGFR -     Lipid panel  Mixed hyperlipidemia -     Rosuvastatin  Calcium ; Take 1 tablet (10 mg total) by mouth daily. For cholesterol **NEEDS TO BE SEEN BEFORE NEXT REFILL**  Dispense: 30 tablet; Refill: 0 -     CBC with Differential/Platelet -     CMP14+EGFR -     Lipid panel  Acute pain of left shoulder -     Diclofenac Sodium; Take 1 tablet (75 mg total) by mouth 2 (two) times daily. For muscle and  Joint pain  Dispense: 60 tablet; Refill: 2 -     Ambulatory referral to Physical Therapy    Follow-up: Return in about 6 months  (around 11/24/2024) for [facility] physical.  Roise Cleaver, M.D.

## 2024-05-26 ENCOUNTER — Ambulatory Visit: Payer: Self-pay | Admitting: Family Medicine

## 2024-05-26 LAB — CBC WITH DIFFERENTIAL/PLATELET
Basophils Absolute: 0.1 10*3/uL (ref 0.0–0.2)
Basos: 1 %
EOS (ABSOLUTE): 0.1 10*3/uL (ref 0.0–0.4)
Eos: 1 %
Hematocrit: 43.8 % (ref 37.5–51.0)
Hemoglobin: 14.6 g/dL (ref 13.0–17.7)
Immature Grans (Abs): 0 10*3/uL (ref 0.0–0.1)
Immature Granulocytes: 0 %
Lymphocytes Absolute: 2.2 10*3/uL (ref 0.7–3.1)
Lymphs: 23 %
MCH: 32.5 pg (ref 26.6–33.0)
MCHC: 33.3 g/dL (ref 31.5–35.7)
MCV: 98 fL — ABNORMAL HIGH (ref 79–97)
Monocytes Absolute: 0.9 10*3/uL (ref 0.1–0.9)
Monocytes: 9 %
Neutrophils Absolute: 6.4 10*3/uL (ref 1.4–7.0)
Neutrophils: 66 %
Platelets: 245 10*3/uL (ref 150–450)
RBC: 4.49 x10E6/uL (ref 4.14–5.80)
RDW: 12.8 % (ref 11.6–15.4)
WBC: 9.6 10*3/uL (ref 3.4–10.8)

## 2024-05-26 LAB — CMP14+EGFR
ALT: 60 IU/L — ABNORMAL HIGH (ref 0–44)
AST: 55 IU/L — ABNORMAL HIGH (ref 0–40)
Albumin: 4.9 g/dL (ref 3.8–4.9)
Alkaline Phosphatase: 66 IU/L (ref 44–121)
BUN/Creatinine Ratio: 25 — ABNORMAL HIGH (ref 9–20)
BUN: 27 mg/dL — ABNORMAL HIGH (ref 6–24)
Bilirubin Total: 0.5 mg/dL (ref 0.0–1.2)
CO2: 23 mmol/L (ref 20–29)
Calcium: 9.4 mg/dL (ref 8.7–10.2)
Chloride: 99 mmol/L (ref 96–106)
Creatinine, Ser: 1.07 mg/dL (ref 0.76–1.27)
Globulin, Total: 2.3 g/dL (ref 1.5–4.5)
Glucose: 83 mg/dL (ref 70–99)
Potassium: 4.5 mmol/L (ref 3.5–5.2)
Sodium: 141 mmol/L (ref 134–144)
Total Protein: 7.2 g/dL (ref 6.0–8.5)
eGFR: 83 mL/min/{1.73_m2} (ref >59)

## 2024-05-26 LAB — LIPID PANEL
Chol/HDL Ratio: 2.1 ratio (ref 0.0–5.0)
Cholesterol, Total: 198 mg/dL (ref 100–199)
HDL: 93 mg/dL (ref >39)
LDL Chol Calc (NIH): 85 mg/dL (ref 0–99)
Triglycerides: 119 mg/dL (ref 0–149)
VLDL Cholesterol Cal: 20 mg/dL (ref 5–40)

## 2024-06-14 ENCOUNTER — Other Ambulatory Visit: Payer: Self-pay | Admitting: Family Medicine

## 2024-06-14 DIAGNOSIS — E782 Mixed hyperlipidemia: Secondary | ICD-10-CM

## 2024-07-06 ENCOUNTER — Ambulatory Visit

## 2024-07-06 ENCOUNTER — Ambulatory Visit: Admitting: Family Medicine

## 2024-07-06 VITALS — BP 144/79 | HR 88 | Temp 98.0°F | Ht 66.0 in | Wt 172.0 lb

## 2024-07-06 DIAGNOSIS — M25512 Pain in left shoulder: Secondary | ICD-10-CM | POA: Diagnosis not present

## 2024-07-06 DIAGNOSIS — E782 Mixed hyperlipidemia: Secondary | ICD-10-CM | POA: Diagnosis not present

## 2024-07-06 MED ORDER — ROSUVASTATIN CALCIUM 10 MG PO TABS: 10.0000 mg | ORAL_TABLET | Freq: Every day | ORAL | 0 refills | Status: DC

## 2024-07-06 MED ORDER — DICLOFENAC SODIUM 75 MG PO TBEC: 75.0000 mg | DELAYED_RELEASE_TABLET | Freq: Two times a day (BID) | ORAL | 2 refills | Status: DC

## 2024-07-06 NOTE — Progress Notes (Unsigned)
 Subjective:  Patient ID: Roberto Kennedy, male    DOB: Feb 03, 1971  Age: 53 y.o. MRN: [id number]  CC: Medical Management of Chronic Issues   HPI Roberto Kennedy presents for continued pain in the left shoulder in spite of treatment including several weeks of physical therapy and steroids.  None of this has given him relief.  He still cannot abduct past about 60 degrees.  There is no known injury.  It had been going on for 2 months as a plan he was last seen a month ago.  He is still having that is moderately severe 6-8 out of 10 at times there is a constant baseline as well.  Patient in for follow-up of elevated cholesterol. Doing well without complaints on current medication. Denies side effects of statin including myalgia and arthralgia and nausea. Also in today for liver function testing. Currently no chest pain, shortness of breath or other cardiovascular related symptoms noted.     03/19/2024   10:46 AM 10/31/2023    2:08 PM 10/03/2023    9:26 AM  Depression screen PHQ 2/9  Decreased Interest 0 0 0  Down, Depressed, Hopeless 0 0 0  PHQ - 2 Score 0 0 0    History Roberto Kennedy has a past medical history of Allergy and Hypertension.   He has a past surgical history that includes Colonoscopy (05/09/2011).   His family history includes Colon cancer (age of onset: 56) in his father.He reports that he has never smoked. He has never used smokeless tobacco. He reports current alcohol use of about 1.0 standard drink of alcohol per week. He reports that he does not use drugs.    ROS Review of Systems  Constitutional:  Negative for fever.  Respiratory:  Negative for shortness of breath.   Cardiovascular:  Negative for chest pain.  Musculoskeletal:  Positive for arthralgias.  Skin:  Negative for rash.    Objective:  BP (!) 144/79   Pulse 88   Temp 98 F (36.7 C)   Ht 5' 6 (1.676 m)   Wt 172 lb (78 kg)   SpO2 97%   BMI 27.76 kg/m   BP Readings from Last 3 Encounters:  07/06/24 (!)  144/79  05/25/24 112/65  03/19/24 126/74    Wt Readings from Last 3 Encounters:  07/06/24 172 lb (78 kg)  05/25/24 169 lb (76.7 kg)  03/19/24 167 lb (75.8 kg)     Physical Exam Vitals reviewed.  Constitutional:      Appearance: He is well-developed.  HENT:     Head: Normocephalic and atraumatic.     Right Ear: External ear normal.     Left Ear: External ear normal.     Mouth/Throat:     Pharynx: No oropharyngeal exudate or posterior oropharyngeal erythema.  Eyes:     Pupils: Pupils are equal, round, and reactive to light.  Cardiovascular:     Rate and Rhythm: Normal rate and regular rhythm.     Heart sounds: No murmur heard. Pulmonary:     Effort: No respiratory distress.     Breath sounds: Normal breath sounds.  Musculoskeletal:        General: Tenderness (Superior aspect of left shoulder.  Also in the bicep area) present.     Cervical back: Normal range of motion and neck supple.  Neurological:     Mental Status: He is alert and oriented to person, place, and time.   At the superior aspect of the left shoulder   Assessment &  Plan:  Acute pain of left shoulder -     Diclofenac  Sodium; Take 1 tablet (75 mg total) by mouth 2 (two) times daily. For muscle and  Joint pain  Dispense: 60 tablet; Refill: 2 -     Ambulatory referral to Orthopedics  Mixed hyperlipidemia -     Rosuvastatin  Calcium ; Take 1 tablet (10 mg total) by mouth daily.  Dispense: 30 tablet; Refill: 0     Follow-up: Return in about 6 weeks (around 08/17/2024), or if symptoms worsen or fail to improve.  Butler Der, M.D.

## 2024-07-08 ENCOUNTER — Encounter: Payer: Self-pay | Admitting: Family Medicine

## 2024-07-28 ENCOUNTER — Other Ambulatory Visit: Payer: Self-pay | Admitting: Family Medicine

## 2024-07-28 ENCOUNTER — Telehealth: Payer: Self-pay | Admitting: Family Medicine

## 2024-07-28 DIAGNOSIS — M25512 Pain in left shoulder: Secondary | ICD-10-CM

## 2024-07-28 NOTE — Telephone Encounter (Signed)
Referral placed, as requested [facility] 

## 2024-07-28 NOTE — Telephone Encounter (Unsigned)
 Copied from CRM #[id number]. Topic: Referral - Question >> Jul 28, 2024  2:53 PM DeAngela L wrote: Reason for CRM: Maddie calling [facility] to ask if the office could send the Physical therapy referral to her office for documentation purposes  Patient came for Rotator cuff concern (patient did an evaluation this morning at 815 am Pateint states this location is close to his home   [facility] (physical therapy) [address] [place], [place], [place] [address]  Maddies direct line [phone] ext #[phone] Fax num [phone]

## 2024-07-29 NOTE — Telephone Encounter (Signed)
 Referral faxed to Maddie at [facility] as requested.

## 2024-08-20 ENCOUNTER — Other Ambulatory Visit: Payer: Self-pay | Admitting: Family Medicine

## 2024-08-20 DIAGNOSIS — E782 Mixed hyperlipidemia: Secondary | ICD-10-CM

## 2024-11-08 ENCOUNTER — Other Ambulatory Visit: Payer: Self-pay | Admitting: Family Medicine

## 2024-11-08 DIAGNOSIS — M25512 Pain in left shoulder: Secondary | ICD-10-CM

## 2025-01-28 ENCOUNTER — Other Ambulatory Visit: Payer: Self-pay | Admitting: Family Medicine

## 2025-01-28 DIAGNOSIS — M25512 Pain in left shoulder: Secondary | ICD-10-CM
